# Patient Record
Sex: Female | Born: 1935 | Race: Black or African American | Hispanic: No | Smoking: Former smoker
Health system: Southern US, Community
[De-identification: ages and names within clinical notes are randomized; demographics above are authoritative.]

## PROBLEM LIST (undated history)

## (undated) DIAGNOSIS — Z8719 Personal history of other diseases of the digestive system: Secondary | ICD-10-CM

## (undated) DIAGNOSIS — K219 Gastro-esophageal reflux disease without esophagitis: Secondary | ICD-10-CM

## (undated) DIAGNOSIS — R55 Syncope and collapse: Secondary | ICD-10-CM

## (undated) DIAGNOSIS — F329 Major depressive disorder, single episode, unspecified: Secondary | ICD-10-CM

## (undated) DIAGNOSIS — M069 Rheumatoid arthritis, unspecified: Secondary | ICD-10-CM

## (undated) DIAGNOSIS — I509 Heart failure, unspecified: Secondary | ICD-10-CM

## (undated) DIAGNOSIS — K922 Gastrointestinal hemorrhage, unspecified: Secondary | ICD-10-CM

## (undated) DIAGNOSIS — M797 Fibromyalgia: Secondary | ICD-10-CM

## (undated) DIAGNOSIS — I639 Cerebral infarction, unspecified: Secondary | ICD-10-CM

## (undated) DIAGNOSIS — Z9289 Personal history of other medical treatment: Secondary | ICD-10-CM

## (undated) DIAGNOSIS — I10 Essential (primary) hypertension: Secondary | ICD-10-CM

## (undated) DIAGNOSIS — E119 Type 2 diabetes mellitus without complications: Secondary | ICD-10-CM

## (undated) DIAGNOSIS — G894 Chronic pain syndrome: Secondary | ICD-10-CM

## (undated) DIAGNOSIS — T39395A Adverse effect of other nonsteroidal anti-inflammatory drugs [NSAID], initial encounter: Secondary | ICD-10-CM

## (undated) DIAGNOSIS — I82629 Acute embolism and thrombosis of deep veins of unspecified upper extremity: Secondary | ICD-10-CM

## (undated) DIAGNOSIS — A419 Sepsis, unspecified organism: Secondary | ICD-10-CM

## (undated) DIAGNOSIS — F419 Anxiety disorder, unspecified: Secondary | ICD-10-CM

## (undated) DIAGNOSIS — E7800 Pure hypercholesterolemia, unspecified: Secondary | ICD-10-CM

## (undated) DIAGNOSIS — F32A Depression, unspecified: Secondary | ICD-10-CM

## (undated) HISTORY — PX: JOINT REPLACEMENT: SHX530

## (undated) HISTORY — PX: TOTAL KNEE ARTHROPLASTY: SHX125

---

## 1969-04-11 HISTORY — PX: VAGINAL HYSTERECTOMY: SUR661

## 2009-08-11 DIAGNOSIS — I639 Cerebral infarction, unspecified: Secondary | ICD-10-CM

## 2009-08-11 DIAGNOSIS — Z9289 Personal history of other medical treatment: Secondary | ICD-10-CM

## 2009-08-11 DIAGNOSIS — I82629 Acute embolism and thrombosis of deep veins of unspecified upper extremity: Secondary | ICD-10-CM

## 2009-08-11 HISTORY — DX: Acute embolism and thrombosis of deep veins of unspecified upper extremity: I82.629

## 2009-08-11 HISTORY — DX: Cerebral infarction, unspecified: I63.9

## 2009-08-11 HISTORY — DX: Personal history of other medical treatment: Z92.89

## 2010-01-09 HISTORY — PX: ANTERIOR CERVICAL DISCECTOMY: SHX1160

## 2010-03-11 HISTORY — PX: POSTERIOR LAMINECTOMY / DECOMPRESSION CERVICAL SPINE: SUR739

## 2010-05-28 ENCOUNTER — Encounter: Admission: RE | Admit: 2010-05-28 | Discharge: 2010-05-28 | Payer: Self-pay | Admitting: Internal Medicine

## 2010-06-11 ENCOUNTER — Encounter (HOSPITAL_COMMUNITY)
Admission: RE | Admit: 2010-06-11 | Discharge: 2010-08-10 | Payer: Self-pay | Source: Home / Self Care | Attending: Internal Medicine | Admitting: Internal Medicine

## 2010-08-31 ENCOUNTER — Encounter: Payer: Self-pay | Admitting: Internal Medicine

## 2012-04-11 DIAGNOSIS — T39395A Adverse effect of other nonsteroidal anti-inflammatory drugs [NSAID], initial encounter: Secondary | ICD-10-CM | POA: Diagnosis present

## 2012-04-11 DIAGNOSIS — K922 Gastrointestinal hemorrhage, unspecified: Secondary | ICD-10-CM

## 2012-04-11 HISTORY — DX: Adverse effect of other nonsteroidal anti-inflammatory drugs (NSAID), initial encounter: K92.2

## 2012-05-18 ENCOUNTER — Observation Stay (HOSPITAL_BASED_OUTPATIENT_CLINIC_OR_DEPARTMENT_OTHER)
Admission: EM | Admit: 2012-05-18 | Discharge: 2012-05-20 | Disposition: A | Discharge status: Home / Self Care | Payer: Medicare Other | Attending: Internal Medicine | Admitting: Internal Medicine

## 2012-05-18 ENCOUNTER — Encounter (HOSPITAL_BASED_OUTPATIENT_CLINIC_OR_DEPARTMENT_OTHER): Payer: Self-pay | Admitting: Emergency Medicine

## 2012-05-18 ENCOUNTER — Emergency Department (HOSPITAL_BASED_OUTPATIENT_CLINIC_OR_DEPARTMENT_OTHER): Payer: Medicare Other

## 2012-05-18 DIAGNOSIS — M6281 Muscle weakness (generalized): Principal | ICD-10-CM | POA: Insufficient documentation

## 2012-05-18 DIAGNOSIS — E7800 Pure hypercholesterolemia, unspecified: Secondary | ICD-10-CM | POA: Insufficient documentation

## 2012-05-18 DIAGNOSIS — E119 Type 2 diabetes mellitus without complications: Secondary | ICD-10-CM | POA: Insufficient documentation

## 2012-05-18 DIAGNOSIS — R5381 Other malaise: Secondary | ICD-10-CM

## 2012-05-18 DIAGNOSIS — M069 Rheumatoid arthritis, unspecified: Secondary | ICD-10-CM | POA: Diagnosis present

## 2012-05-18 DIAGNOSIS — K922 Gastrointestinal hemorrhage, unspecified: Secondary | ICD-10-CM

## 2012-05-18 DIAGNOSIS — D899 Disorder involving the immune mechanism, unspecified: Secondary | ICD-10-CM

## 2012-05-18 DIAGNOSIS — I509 Heart failure, unspecified: Secondary | ICD-10-CM | POA: Insufficient documentation

## 2012-05-18 DIAGNOSIS — Z86718 Personal history of other venous thrombosis and embolism: Secondary | ICD-10-CM | POA: Insufficient documentation

## 2012-05-18 DIAGNOSIS — Z8673 Personal history of transient ischemic attack (TIA), and cerebral infarction without residual deficits: Secondary | ICD-10-CM | POA: Insufficient documentation

## 2012-05-18 DIAGNOSIS — K219 Gastro-esophageal reflux disease without esophagitis: Secondary | ICD-10-CM | POA: Insufficient documentation

## 2012-05-18 DIAGNOSIS — R509 Fever, unspecified: Secondary | ICD-10-CM

## 2012-05-18 DIAGNOSIS — I82629 Acute embolism and thrombosis of deep veins of unspecified upper extremity: Secondary | ICD-10-CM

## 2012-05-18 DIAGNOSIS — T39395A Adverse effect of other nonsteroidal anti-inflammatory drugs [NSAID], initial encounter: Secondary | ICD-10-CM

## 2012-05-18 DIAGNOSIS — R531 Weakness: Secondary | ICD-10-CM | POA: Diagnosis present

## 2012-05-18 DIAGNOSIS — R55 Syncope and collapse: Secondary | ICD-10-CM

## 2012-05-18 DIAGNOSIS — I10 Essential (primary) hypertension: Secondary | ICD-10-CM | POA: Insufficient documentation

## 2012-05-18 DIAGNOSIS — IMO0001 Reserved for inherently not codable concepts without codable children: Secondary | ICD-10-CM | POA: Insufficient documentation

## 2012-05-18 DIAGNOSIS — F3289 Other specified depressive episodes: Secondary | ICD-10-CM | POA: Insufficient documentation

## 2012-05-18 DIAGNOSIS — F411 Generalized anxiety disorder: Secondary | ICD-10-CM | POA: Insufficient documentation

## 2012-05-18 DIAGNOSIS — Z791 Long term (current) use of non-steroidal anti-inflammatories (NSAID): Secondary | ICD-10-CM | POA: Insufficient documentation

## 2012-05-18 DIAGNOSIS — M797 Fibromyalgia: Secondary | ICD-10-CM | POA: Diagnosis present

## 2012-05-18 DIAGNOSIS — F329 Major depressive disorder, single episode, unspecified: Secondary | ICD-10-CM | POA: Insufficient documentation

## 2012-05-18 HISTORY — DX: Chronic pain syndrome: G89.4

## 2012-05-18 HISTORY — DX: Personal history of other medical treatment: Z92.89

## 2012-05-18 HISTORY — DX: Anxiety disorder, unspecified: F41.9

## 2012-05-18 HISTORY — DX: Heart failure, unspecified: I50.9

## 2012-05-18 HISTORY — DX: Major depressive disorder, single episode, unspecified: F32.9

## 2012-05-18 HISTORY — DX: Rheumatoid arthritis, unspecified: M06.9

## 2012-05-18 HISTORY — DX: Essential (primary) hypertension: I10

## 2012-05-18 HISTORY — DX: Pure hypercholesterolemia, unspecified: E78.00

## 2012-05-18 HISTORY — DX: Syncope and collapse: R55

## 2012-05-18 HISTORY — DX: Adverse effect of other nonsteroidal anti-inflammatory drugs (NSAID), initial encounter: T39.395A

## 2012-05-18 HISTORY — DX: Type 2 diabetes mellitus without complications: E11.9

## 2012-05-18 HISTORY — DX: Cerebral infarction, unspecified: I63.9

## 2012-05-18 HISTORY — DX: Gastrointestinal hemorrhage, unspecified: K92.2

## 2012-05-18 HISTORY — DX: Gastro-esophageal reflux disease without esophagitis: K21.9

## 2012-05-18 HISTORY — DX: Sepsis, unspecified organism: A41.9

## 2012-05-18 HISTORY — DX: Acute embolism and thrombosis of deep veins of unspecified upper extremity: I82.629

## 2012-05-18 HISTORY — DX: Personal history of other diseases of the digestive system: Z87.19

## 2012-05-18 HISTORY — DX: Fibromyalgia: M79.7

## 2012-05-18 HISTORY — DX: Depression, unspecified: F32.A

## 2012-05-18 LAB — CBC WITH DIFFERENTIAL/PLATELET
Basophils Absolute: 0 10*3/uL (ref 0.0–0.1)
Basophils Relative: 0 % (ref 0–1)
Hemoglobin: 11.1 g/dL — ABNORMAL LOW (ref 12.0–15.0)
Lymphocytes Relative: 21 % (ref 12–46)
MCHC: 33.1 g/dL (ref 30.0–36.0)
Monocytes Relative: 11 % (ref 3–12)
Neutro Abs: 6.3 10*3/uL (ref 1.7–7.7)
Neutrophils Relative %: 67 % (ref 43–77)
WBC: 9.4 10*3/uL (ref 4.0–10.5)

## 2012-05-18 LAB — COMPREHENSIVE METABOLIC PANEL
AST: 21 U/L (ref 0–37)
Albumin: 3 g/dL — ABNORMAL LOW (ref 3.5–5.2)
Alkaline Phosphatase: 29 U/L — ABNORMAL LOW (ref 39–117)
BUN: 8 mg/dL (ref 6–23)
Chloride: 94 mEq/L — ABNORMAL LOW (ref 96–112)
Potassium: 3.7 mEq/L (ref 3.5–5.1)
Total Bilirubin: 0.6 mg/dL (ref 0.3–1.2)

## 2012-05-18 LAB — LACTIC ACID, PLASMA: Lactic Acid, Venous: 1 mmol/L (ref 0.5–2.2)

## 2012-05-18 LAB — URINALYSIS, ROUTINE W REFLEX MICROSCOPIC
Nitrite: NEGATIVE
Specific Gravity, Urine: 1.011 (ref 1.005–1.030)
Urobilinogen, UA: 1 mg/dL (ref 0.0–1.0)

## 2012-05-18 LAB — TROPONIN I
Troponin I: <0.3 ng/mL (ref <0.30)
Troponin I: <0.3 ng/mL (ref <0.30)

## 2012-05-18 MED ORDER — CARVEDILOL 12.5 MG PO TABS
12.5000 mg | ORAL_TABLET | Freq: Every day | ORAL | Status: DC
  Administered 2012-05-19 – 2012-05-20 (×2): 12.5 mg via ORAL
  Filled 2012-05-18 (×3): qty 1

## 2012-05-18 MED ORDER — ONDANSETRON HCL 4 MG/2ML IJ SOLN
4.0000 mg | Freq: Once | INTRAMUSCULAR | Status: AC
  Administered 2012-05-18: 4 mg via INTRAVENOUS
  Filled 2012-05-18: qty 2

## 2012-05-18 MED ORDER — ONDANSETRON HCL 4 MG/2ML IJ SOLN: 4.0000 mg | Freq: Four times a day (QID) | INTRAMUSCULAR | Status: DC | PRN

## 2012-05-18 MED ORDER — SODIUM CHLORIDE 0.9 % IJ SOLN
3.0000 mL | Freq: Two times a day (BID) | INTRAMUSCULAR | Status: DC
  Administered 2012-05-18 – 2012-05-20 (×2): 3 mL via INTRAVENOUS

## 2012-05-18 MED ORDER — SODIUM CHLORIDE 0.9 % IV BOLUS (SEPSIS)
500.0000 mL | Freq: Once | INTRAVENOUS | Status: AC
  Administered 2012-05-18: 1000 mL via INTRAVENOUS

## 2012-05-18 MED ORDER — SODIUM CHLORIDE 0.9 % IJ SOLN: 3.0000 mL | Freq: Two times a day (BID) | INTRAMUSCULAR | Status: DC

## 2012-05-18 MED ORDER — SODIUM CHLORIDE 0.9 % IJ SOLN: 3.0000 mL | INTRAMUSCULAR | Status: DC | PRN

## 2012-05-18 MED ORDER — ONDANSETRON HCL 4 MG PO TABS: 4.0000 mg | ORAL_TABLET | Freq: Four times a day (QID) | ORAL | Status: DC | PRN

## 2012-05-18 MED ORDER — MORPHINE SULFATE 2 MG/ML IJ SOLN
2.0000 mg | Freq: Once | INTRAMUSCULAR | Status: AC
  Administered 2012-05-18: 2 mg via INTRAVENOUS
  Filled 2012-05-18: qty 1

## 2012-05-18 MED ORDER — PANTOPRAZOLE SODIUM 40 MG PO TBEC
40.0000 mg | DELAYED_RELEASE_TABLET | Freq: Every day | ORAL | Status: DC
  Administered 2012-05-19 – 2012-05-20 (×2): 40 mg via ORAL
  Filled 2012-05-18 (×2): qty 1

## 2012-05-18 MED ORDER — SODIUM CHLORIDE 0.9 % IV SOLN: 250.0000 mL | INTRAVENOUS | Status: DC | PRN

## 2012-05-18 MED ORDER — SODIUM CHLORIDE 0.9 % IV SOLN
Freq: Once | INTRAVENOUS | Status: AC
  Administered 2012-05-18: 50 mL via INTRAVENOUS

## 2012-05-18 MED ORDER — SODIUM CHLORIDE 0.9 % IV SOLN: INTRAVENOUS | Status: DC

## 2012-05-18 MED ORDER — ACETAMINOPHEN 325 MG PO TABS
650.0000 mg | ORAL_TABLET | Freq: Once | ORAL | Status: AC
  Administered 2012-05-18: 650 mg via ORAL
  Filled 2012-05-18: qty 2

## 2012-05-18 NOTE — ED Notes (Signed)
Report given to Diane at [facility].

## 2012-05-18 NOTE — Progress Notes (Signed)
RN has paged [facility] admissions for the second time this shift to get admission orders for pt. RN still hasn't received a call back. -Judy Pimple, RN

## 2012-05-18 NOTE — ED Notes (Signed)
Patient given more blankets for comfort. In and out cath done for urine. Patient temp currently 98.2.

## 2012-05-18 NOTE — ED Provider Notes (Cosign Needed)
History     CSN: [id number]  Arrival date & time 05/18/12  1120   First MD Initiated Contact with Patient 05/18/12 1159      Chief Complaint  Patient presents with  . Failure To Thrive    (Consider location/radiation/quality/duration/timing/severity/associated sxs/prior treatment) HPI Patient states nauseated, anxious became lightheaded and had near syncopal episode.  Denies vertigo.  Patient states she got up this a.m. And went in to take meds and went in kitchen and had cereal and mile.  Took other half of vicodin for pain from arthritis, prescribed for left shoulder.  Patien thad needle biopsy of left shoulder end of September.  Patient on humera for ra.  Patient had gi bleed two week ago states just discharged a week ago Sunday, then at [place] last week because she got sick after taking vicodin.  She states she only took a half pill today but became nauseated again.   PMD Dr. Emi Holes at [facility] and Dr. Ancil Linsey rheumatology.  Past Medical History  Diagnosis Date  . Hypertension   . Diabetes mellitus without complication   . Rheumatoid arthritis   . Sepsis     Past Surgical History  Procedure Date  . Abdominal hysterectomy   . Neck surgery     No family history on file.  History  Substance Use Topics  . Smoking status: Not on file  . Smokeless tobacco: Not on file  . Alcohol Use:     OB History    Grav Para Term Preterm Abortions TAB SAB Ect Mult Living                  Review of Systems  Constitutional: Positive for fever, chills and unexpected weight change. Negative for activity change and appetite change.  HENT: Positive for hearing loss, neck pain and neck stiffness. Negative for sore throat, rhinorrhea and sinus pressure.   Eyes: Negative for visual disturbance.  Respiratory: Positive for cough. Negative for shortness of breath.   Cardiovascular: Negative for chest pain and leg swelling.  Gastrointestinal: Negative for vomiting,  abdominal pain, diarrhea and blood in stool.  Genitourinary: Positive for frequency. Negative for dysuria, urgency, vaginal discharge and difficulty urinating.  Musculoskeletal: Negative for myalgias, arthralgias and gait problem.  Skin: Negative for color change and rash.  Neurological: Negative for weakness, light-headedness and headaches.  Hematological: Does not bruise/bleed easily.  Psychiatric/Behavioral: Negative for dysphoric mood.    Allergies  Codeine  Home Medications   Current Outpatient Rx  Name Route Sig Dispense Refill  . ATORVASTATIN CALCIUM PO Oral Take by mouth.    Marland Kitchen HYDROCODONE-ACETAMINOPHEN 5-325 MG PO TABS Oral Take 1 tablet by mouth every 6 (six) hours as needed.    Marland Kitchen METFORMIN HCL PO Oral Take by mouth.    Marland Kitchen PANTOPRAZOLE SODIUM PO Oral Take by mouth.    Marland Kitchen PREDNISONE (PAK) PO Oral Take by mouth.    Marland Kitchen LYRICA PO Oral Take by mouth.    . TRAMADOL HCL 50 MG PO TBDP Oral Take by mouth.      BP 155/95  Pulse 119  Temp 100.7 F (38.2 C) (Oral)  Resp 16  SpO2 95%  Physical Exam  Nursing note and vitals reviewed. Constitutional: She is oriented to person, place, and time. She appears well-developed and well-nourished.  HENT:  Head: Normocephalic and atraumatic.  Right Ear: External ear normal.  Left Ear: External ear normal.  Eyes: Conjunctivae normal are normal. Pupils are equal, round, and reactive  to light.  Neck: Normal range of motion.  Cardiovascular: Normal heart sounds and intact distal pulses.  Tachycardia present.   Pulmonary/Chest: Effort normal and breath sounds normal.  Abdominal: Soft. Bowel sounds are normal.  Musculoskeletal: Normal range of motion.  Neurological: She is alert and oriented to person, place, and time.  Skin: Skin is warm and dry.  Psychiatric: She has a normal mood and affect.    ED Course  Procedures (including critical care time)  Labs Reviewed - No data to display No results found.   No diagnosis  found.    MDM  Patient's hr decreased after iv fluids but remains tachycardiac with elevated temp and unclear etiology on humera for ra. Plan admission over night to follow hr and lactic acid and follow up blood cultures as no focus of infection found.        Hilario Quarry, MD 05/18/12 1524  Patient care discussed with Dr. Suanne Marker and plan transfer to [facility] to tele bed team 10.   Hilario Quarry, MD 05/18/12 [phone]

## 2012-05-18 NOTE — H&P (Signed)
Chief Complaint:  weakness  HPI: 76 yo female h/o RA on humara for 10 years, recent nsaid induced gib (no PUD per family), comes in after experiencing some generalized weakness this am after taking 1/2 pill of percocet.  ems was called and pt was found to have a low grade temp so was then brought to urgent care.  Pt denies any recent illnesses, no cough/n/v/d/abd pain/sob/cp.  She has had a reaction like this to hydrocodone about a week or so which also resulted in a hospitalization at antoher facility.  She feels back to her baseline status right now.  Pt was transferred here for obs from [facility] center due to her immunosuppressed status and low grade fever.  cxr and ua neg.  Review of Systems:  O/w neg  Past Medical History: Past Medical History  Diagnosis Date  . Hypertension   . Sepsis   . Syncope, near 05/18/2012  . GI bleed due to NSAIDs 04/2012    hospitalized  . H/O hiatal hernia   . High cholesterol   . CHF (congestive heart failure)   . DVT of upper extremity (deep vein thrombosis) 2011    "after a surgery"  . Type II diabetes mellitus   . History of blood transfusion 2011    "post Coumadin administered for DVT, got 6 units PRBCs cause she was bleeing out in her stools"  . GERD (gastroesophageal reflux disease)   . Rheumatoid arthritis     "all over" (05/18/2012)  . Stroke 2011    "found old mini strokes on CAT scan" (05/18/2012)  . Fibromyalgia     "questionable" (05/18/2012)  . Chronic pain syndrome   . Anxiety   . Depression    Past Surgical History  Procedure Date  . Anterior cervical discectomy 01/2010  . Posterior laminectomy / decompression cervical spine 03/2010  . Total knee arthroplasty ~ 2001    right; "found broken bone in leg during OR" (05/18/2012)  . Vaginal hysterectomy 1970's  . Joint replacement     Medications: Prior to Admission medications   Medication Sig Start Date End Date Taking? Authorizing Provider  Adalimumab (HUMIRA) 20 MG/0.4ML KIT  Inject 20 mg into the skin every 14 (fourteen) days.   Yes Historical Provider, MD  ATORVASTATIN CALCIUM PO Take 1 tablet by mouth daily.    Yes Historical Provider, MD  carvedilol (COREG) 12.5 MG tablet Take 12.5 mg by mouth every morning.   Yes Historical Provider, MD  HYDROcodone-acetaminophen (NORCO/VICODIN) 5-325 MG per tablet Take 1 tablet by mouth every 6 (six) hours as needed. For pain   Yes Historical Provider, MD  METFORMIN HCL PO Take 1 tablet by mouth 2 (two) times daily.    Yes Historical Provider, MD  pantoprazole (PROTONIX) 40 MG tablet Take 40 mg by mouth daily.   Yes Historical Provider, MD  traMADol (ULTRAM) 50 MG tablet Take 50 mg by mouth every 4 (four) hours as needed. For pain   Yes Historical Provider, MD    Allergies:   Allergies  Allergen Reactions  . Codeine Nausea And Vomiting    Social History:  reports that she has quit smoking. Her smoking use included Cigarettes. She quit after 43 years of use. She has never used smokeless tobacco. She reports that she does not drink alcohol or use illicit drugs.  Family History: History reviewed. No pertinent family history.  Physical Exam: Filed Vitals:   05/18/12 1733 05/18/12 1738 05/18/12 1852 05/18/12 2119  BP:  121/65 120/50 147/72  Pulse: 107  110 97  Temp: 99.5 F (37.5 C)  96.8 F (36 C) 98.8 F (37.1 C)  TempSrc:   Oral Oral  Resp: 16  20 20   SpO2: 91%  90% 100%   General appearance: alert, cooperative and no distress Neck: no JVD and supple, symmetrical, trachea midline Lungs: clear to auscultation bilaterally Heart: regular rate and rhythm, S1, S2 normal, no murmur, click, rub or gallop Abdomen: soft, non-tender; bowel sounds normal; no masses,  no organomegaly Extremities: extremities normal, atraumatic, no cyanosis or edema Pulses: 2+ and symmetric Skin: Skin color, texture, turgor normal. No rashes or lesions Neurologic: Grossly normal    Labs on Admission:   [facility] 05/18/12 1247  NA  131*  K 3.7  CL 94*  CO2 27  GLUCOSE 211*  BUN 8  CREATININE 0.50  CALCIUM 9.1  MG --  PHOS --    Basename 05/18/12 1247  AST 21  ALT 17  ALKPHOS 29*  BILITOT 0.6  PROT 6.4  ALBUMIN 3.0*    Basename 05/18/12 1247  WBC 9.4  NEUTROABS 6.3  HGB 11.1*  HCT 33.5*  MCV 86.1  PLT 322    Basename 05/18/12 1247  CKTOTAL --  CKMB --  CKMBINDEX --  TROPONINI <0.30   Radiological Exams on Admission: Dg Chest 2 View  05/18/2012  *RADIOLOGY REPORT*  Clinical Data: Pain.  Fever.  Weakness.  Sepsis.  Diabetes. Rheumatoid arthropathy.  CHEST - 2 VIEW  Comparison: 05/28/2010  Findings: Atherosclerotic aortic arch noted with mild cardiomegaly. Glenohumeral arthropathy noted bilaterally.  Accentuated upper lobe vascularity favors pulmonary venous hypertension.  No overt edema.  No pleural effusion identified.  Thoracic spondylosis is present.  IMPRESSION: 1.  Cardiomegaly with pulmonary venous hypertension.  2.  Atherosclerotic aortic arch.  3.  Arthropathy of both glenohumeral joints.   Original Report Authenticated By: Dellia Cloud, M.D.     Assessment/Plan Present on Admission:  76 yo female generalized weakness and fever of unclear etiology .Fever .RA (rheumatoid arthritis) .Diabetes .Weakness generalized .CHF (congestive heart failure) .DVT of upper extremity (deep vein thrombosis) history .Fibromyalgia .GI bleed due to NSAIDs  No clear source of infection.  Exam is normal, normal neuro exam.  Weakness likely due to the hydrocodone that she took on an empty stomach this am.  Monitor closely overnight for any further fevers.  Blood cx pending.  Hold off on abx at this time.  Ck orthostatics also and neuro cks overnight.  chf compensated.   Stanley Lyness A [phone] 05/18/2012, 10:36 PM

## 2012-05-18 NOTE — ED Notes (Signed)
EMS was called out for dizziness and feeling like she was "going to pass out."  Was recently at [facility] for GI bleed.  Daughter states she feels she just took oxycodone last night and no coreg this am.

## 2012-05-18 NOTE — Progress Notes (Signed)
Patient is a 76 year old history of rheumatoid arthritis on Humira, hypertension, diabetes who presented to the [facility] ED with generalized body pains/aches and was found to be febrile to 100.8 tachycardic to the 120s. Chest x-ray with no acute infiltrates and urinalysis unremarkable, lactic acid level within normal limits. Blood and urine cultures ordered and she needs admission-H& P. and orders to telemetry bed.

## 2012-05-19 ENCOUNTER — Encounter (HOSPITAL_COMMUNITY): Payer: Self-pay | Admitting: *Deleted

## 2012-05-19 DIAGNOSIS — I509 Heart failure, unspecified: Secondary | ICD-10-CM

## 2012-05-19 LAB — GLUCOSE, CAPILLARY
Glucose-Capillary: 81 mg/dL (ref 70–99)
Glucose-Capillary: 109 mg/dL — ABNORMAL HIGH (ref 70–99)

## 2012-05-19 LAB — BASIC METABOLIC PANEL
Chloride: 98 mEq/L (ref 96–112)
GFR calc Af Amer: >90 mL/min (ref >90)
GFR calc non Af Amer: >90 mL/min (ref >90)
Potassium: 3.7 mEq/L (ref 3.5–5.1)
Sodium: 132 mEq/L — ABNORMAL LOW (ref 135–145)

## 2012-05-19 LAB — CBC
HCT: 33 % — ABNORMAL LOW (ref 36.0–46.0)
Hemoglobin: 10.6 g/dL — ABNORMAL LOW (ref 12.0–15.0)
RBC: 3.83 MIL/uL — ABNORMAL LOW (ref 3.87–5.11)

## 2012-05-19 LAB — URINE CULTURE: Culture: NO GROWTH

## 2012-05-19 LAB — TROPONIN I: Troponin I: <0.3 ng/mL (ref <0.30)

## 2012-05-19 MED ORDER — ALPRAZOLAM 0.25 MG PO TABS
0.2500 mg | ORAL_TABLET | Freq: Three times a day (TID) | ORAL | Status: DC | PRN
  Administered 2012-05-19: 0.25 mg via ORAL
  Filled 2012-05-19: qty 1

## 2012-05-19 MED ORDER — ACETAMINOPHEN 500 MG PO TABS
1000.0000 mg | ORAL_TABLET | Freq: Three times a day (TID) | ORAL | Status: DC
  Administered 2012-05-19 – 2012-05-20 (×4): 1000 mg via ORAL
  Filled 2012-05-19 (×7): qty 2

## 2012-05-19 MED ORDER — TRAMADOL HCL 50 MG PO TABS
50.0000 mg | ORAL_TABLET | Freq: Four times a day (QID) | ORAL | Status: DC | PRN
  Administered 2012-05-19: 50 mg via ORAL
  Filled 2012-05-19: qty 1

## 2012-05-19 MED ORDER — INSULIN ASPART 100 UNIT/ML ~~LOC~~ SOLN: 0.0000 [IU] | Freq: Three times a day (TID) | SUBCUTANEOUS | Status: DC

## 2012-05-19 MED ORDER — SODIUM CHLORIDE 0.9 % IV SOLN
INTRAVENOUS | Status: AC
  Administered 2012-05-19: 50 mL via INTRAVENOUS

## 2012-05-19 MED ORDER — TRAMADOL HCL 50 MG PO TABS
50.0000 mg | ORAL_TABLET | Freq: Once | ORAL | Status: AC
  Administered 2012-05-19: 50 mg via ORAL
  Filled 2012-05-19: qty 1

## 2012-05-19 MED ORDER — LORAZEPAM 0.5 MG PO TABS
0.5000 mg | ORAL_TABLET | Freq: Once | ORAL | Status: AC
  Administered 2012-05-19: 0.5 mg via ORAL
  Filled 2012-05-19: qty 1

## 2012-05-19 MED ORDER — HYDROCODONE-ACETAMINOPHEN 5-325 MG PO TABS: 1.0000 | ORAL_TABLET | Freq: Once | ORAL | Status: DC

## 2012-05-19 NOTE — Evaluation (Signed)
Physical Therapy Evaluation Patient Details Name: Theresa Hawkins MRN: [id number] DOB: 1935/09/02 Today's Date: 05/19/2012 Time: 4098-1191 PT Time Calculation (min): 46 min  PT Assessment / Plan / Recommendation Clinical Impression  pt presents with weakness and RA.  pt generally unsteady and requires A for all OOB activity.  pt interested in speaking with SW about planning for future need of Nursing Home vs ALF and has lots of questions.  If unable to have SW on acute can we get order for HHSW?  pt would benefit from HHPT and return to daughters home.      PT Assessment  Patient needs continued PT services    Follow Up Recommendations  Home health PT;Supervision/Assistance - 24 hour    Does the patient have the potential to tolerate intense rehabilitation      Barriers to Discharge None      Equipment Recommendations  None recommended by PT    Recommendations for Other Services  (SW Consult)   Frequency Min 3X/week    Precautions / Restrictions Precautions Precautions: Fall Restrictions Weight Bearing Restrictions: No   Pertinent Vitals/Pain Pt indicates RA pain in Shoulders and L knee.        Mobility  Bed Mobility Bed Mobility: Not assessed Transfers Transfers: Sit to Stand;Stand to Sit Sit to Stand: 4: Min assist;3: Mod assist;With upper extremity assist;From chair/3-in-1;With armrests Stand to Sit: 4: Min assist;With upper extremity assist;To chair/3-in-1;With armrests Details for Transfer Assistance: pt ModA from low recliner, but only MinA from 3-in-1.  pt notes she uses seat cushions at home.  Pillows placed in recliner seat.  pt rocks to use momentum with sit to stand, and difficulty controlling descent.   Ambulation/Gait Ambulation/Gait Assistance: 4: Min assist Ambulation Distance (Feet): 100 Feet Assistive device: Straight cane Ambulation/Gait Assistance Details: pt generally unsteady and at times needs handrail in hallway.  pt with trouble holding cane  secondary to RA in hands and often switches hands.   Gait Pattern: Step-through pattern;Decreased stride length;Trunk flexed;Wide base of support Stairs: No Wheelchair Mobility Wheelchair Mobility: No    Shoulder Instructions     Exercises     PT Diagnosis: Difficulty walking  PT Problem List: Decreased strength;Decreased range of motion;Decreased activity tolerance;Decreased balance;Decreased mobility;Decreased knowledge of use of DME;Pain PT Treatment Interventions: DME instruction;Gait training;Stair training;Functional mobility training;Therapeutic activities;Therapeutic exercise;Balance training;Patient/family education   PT Goals Acute Rehab PT Goals PT Goal Formulation: With patient Time For Goal Achievement: 06/02/12 Potential to Achieve Goals: Good Pt will go Supine/Side to Sit: with modified independence PT Goal: Supine/Side to Sit - Progress: Goal set today Pt will go Sit to Supine/Side: with modified independence PT Goal: Sit to Supine/Side - Progress: Goal set today Pt will go Sit to Stand: with modified independence PT Goal: Sit to Stand - Progress: Goal set today Pt will go Stand to Sit: with modified independence PT Goal: Stand to Sit - Progress: Goal set today Pt will Ambulate: >150 feet;with modified independence;with least restrictive assistive device PT Goal: Ambulate - Progress: Goal set today Pt will Go Up / Down Stairs: 3-5 stairs;with min assist;with rail(s) PT Goal: Up/Down Stairs - Progress: Goal set today  Visit Information  Last PT Received On: 05/19/12 Assistance Needed: +1    Subjective Data  Subjective: That medicine just made me so weak.   Patient Stated Goal: Home   Prior Functioning  Home Living Lives With: Daughter Available Help at Discharge: Family;Available 24 hours/day Type of Home: House Home Access: Stairs to enter Entrance  Stairs-Number of Steps: 3 Home Layout: Two level;Able to live on main level with bedroom/bathroom Home  Adaptive Equipment: Bedside commode/3-in-1;Raised toilet seat with rails;Shower chair with back;Straight cane;Walker - rolling;Wheelchair - manual;Electric Scooter;Hospital bed Additional Comments: pt has her own home, but has been staying with daughter.   Prior Function Level of Independence: Independent with assistive device(s) Able to Take Stairs?: Yes (only a few, not a whole flight.  ) Driving: No Vocation: Retired Musician: No difficulties    Cognition  Overall Cognitive Status: Appears within functional limits for tasks assessed/performed Arousal/Alertness: Awake/alert Orientation Level: Appears intact for tasks assessed Behavior During Session: [facility] for tasks performed    Extremity/Trunk Assessment Right Lower Extremity Assessment RLE ROM/Strength/Tone: Deficits RLE ROM/Strength/Tone Deficits: Limited knee flexion to ~90 degrees secondary to pain.  pt indicates plan for R TKA.   RLE Sensation: WFL - Light Touch Left Lower Extremity Assessment LLE ROM/Strength/Tone: Deficits LLE ROM/Strength/Tone Deficits: pt's RA limits ankle/foot ROM and general strength 4-/5 LLE Sensation: WFL - Light Touch Trunk Assessment Trunk Assessment: Kyphotic   Balance Balance Balance Assessed: Yes Static Standing Balance Static Standing - Balance Support: Right upper extremity supported Static Standing - Level of Assistance: 5: Stand by assistance Static Standing - Comment/# of Minutes: pt able to stand with unilateral UE support, but once incorperating activities pt requires MinA to maintain balance.    End of Session PT - End of Session Equipment Utilized During Treatment: Gait belt Activity Tolerance: Patient tolerated treatment well Patient left: in chair;with call bell/phone within reach Nurse Communication: Mobility status  GP Functional Assessment Tool Used: Clinical Judgement Functional Limitation: Mobility: Walking and moving around Mobility: Walking and Moving  Around Current Status (J1914): At least 20 percent but less than 40 percent impaired, limited or restricted Mobility: Walking and Moving Around Goal Status [phone]): At least 1 percent but less than 20 percent impaired, limited or restricted   Sunny Schlein,  [phone] 05/19/2012, 3:17 PM

## 2012-05-19 NOTE — Progress Notes (Signed)
Pt refuses to stand and let NT get her orthostatic vital signs

## 2012-05-19 NOTE — Progress Notes (Signed)
Pt refused to stand for her orthostatic vital signs

## 2012-05-19 NOTE — Progress Notes (Signed)
PATIENT DETAILS Name: Theresa Hawkins Age: 76 y.o. Sex: female Date of Birth: May 30, 1936 Admit Date: 05/18/2012 Admitting Physician Kela Millin, MD PCP:No primary provider on file.  Subjective: Low grade temp overnight  Assessment/Plan: Principal Problem:  *Weakness generalized -although low grade fever-not toxic looking-stable vitals -given immuno-suppression-infection is suspected-however no obvious source apparent-Urine and Blood cultures pending -will not start any antibiotics at present-unless worsening of patient's condition -apparently-has had similar problems with vicoding in the past-will not re-challenge-seems to tolerate Ultram better -get PT  Active Problems:  Fever -as above   RA (rheumatoid arthritis) -follow's up with Rheum as outpatinet -on Humira -painful mulltiple joints-will not give NSAID's as recent UGI bleed-start scheduled Tylenol and prn Ultram   Diabetes -stable with SSI -resume Metformin on discharge   GI bleed due to NSAIDs -c/w PPI -obtain discharge summary from [facility]   CHF (congestive heart failure) -?systolic vs diastolic -clinically compensated -c/w coreg -await futher records from [facility]   Fibromyalgia -ultram and tylenol  Anxiety -as needed Xanax  Disposition: Remain inpatient  DVT Prophylaxis: SCD's till d/c summary from [facility] obtained  Code Status: Full code or DNR  Procedures:  None  CONSULTS:  None  PHYSICAL EXAM: Vital signs in last 24 hours: Filed Vitals:   05/19/12 0300 05/19/12 0452 05/19/12 0519 05/19/12 0903  BP:  125/63  123/70  Pulse:  115  97  Temp:  100.2 F (37.9 C)    TempSrc:  Oral    Resp:  18    Height: 5\' 5"  (1.651 m)     Weight:   71 kg (156 lb 8.4 oz)   SpO2:  99%      Weight change:  Body mass index is 26.05 kg/(m^2).   Gen Exam: Awake and alert with clear speech.   Neck: Supple, No JVD.   Chest: B/L Clear.   CVS: S1 S2  Regular, no murmurs.  Abdomen: soft, BS +, non tender, non distended.  Extremities: no edema, lower extremities warm to touch. Neurologic: Non Focal.   Skin: No Rash.   Wounds: N/A.    Intake/Output from previous day:  Intake/Output Summary (Last 24 hours) at 05/19/12 1235 Last data filed at 05/19/12 0900  Gross per 24 hour  Intake    240 ml  Output    200 ml  Net     40 ml     LAB RESULTS: CBC  Lab 05/19/12 0510 05/18/12 1247  WBC 9.3 9.4  HGB 10.6* 11.1*  HCT 33.0* 33.5*  PLT 318 322  MCV 86.2 86.1  MCH 27.7 28.5  MCHC 32.1 33.1  RDW 13.7 13.3  LYMPHSABS -- 1.9  MONOABS -- 1.0  EOSABS -- 0.2  BASOSABS -- 0.0  BANDABS -- --    Chemistries   Lab 05/19/12 0510 05/18/12 1247  NA 132* 131*  K 3.7 3.7  CL 98 94*  CO2 25 27  GLUCOSE 195* 211*  BUN 6 8  CREATININE 0.50 0.50  CALCIUM 9.2 9.1  MG -- --    CBG:  Lab 05/19/12 1204 05/19/12 0736 05/18/12 2122  GLUCAP 134* 164* 81    GFR Estimated Creatinine Clearance: 61 ml/min (by C-G formula based on Cr of 0.5).  Coagulation profile No results found for this basename: INR:5,PROTIME:5 in the last 168 hours  Cardiac Enzymes  Lab 05/19/12 0510 05/18/12 2252 05/18/12 1247  CKMB -- -- --  TROPONINI <0.30 <0.30 <0.30  MYOGLOBIN -- -- --  No components found with this basename: POCBNP:3 No results found for this basename: DDIMER:2 in the last 72 hours No results found for this basename: HGBA1C:2 in the last 72 hours No results found for this basename: CHOL:2,HDL:2,LDLCALC:2,TRIG:2,CHOLHDL:2,LDLDIRECT:2 in the last 72 hours  Basename 05/18/12 2253  TSH 0.319*  T4TOTAL --  T3FREE --  THYROIDAB --   No results found for this basename: VITAMINB12:2,FOLATE:2,FERRITIN:2,TIBC:2,IRON:2,RETICCTPCT:2 in the last 72 hours No results found for this basename: LIPASE:2,AMYLASE:2 in the last 72 hours  Urine Studies No results found for this basename:  UACOL:2,UAPR:2,USPG:2,UPH:2,UTP:2,UGL:2,UKET:2,UBIL:2,UHGB:2,UNIT:2,UROB:2,ULEU:2,UEPI:2,UWBC:2,URBC:2,UBAC:2,CAST:2,CRYS:2,UCOM:2,BILUA:2 in the last 72 hours  MICROBIOLOGY: Recent Results (from the past 240 hour(s))  CULTURE, BLOOD (ROUTINE X 2)     Status: Normal (Preliminary result)   Collection Time   05/18/12 12:47 PM      Component Value Range Status Comment   Specimen Description Blood   Final    Special Requests Immunocompromised   Final    Culture  Setup Time 05/18/2012 16:03   Final    Culture     Final    Value:        BLOOD CULTURE RECEIVED NO GROWTH TO DATE CULTURE WILL BE HELD FOR 5 DAYS BEFORE ISSUING A FINAL NEGATIVE REPORT   Report Status PENDING   Incomplete   CULTURE, BLOOD (ROUTINE X 2)     Status: Normal (Preliminary result)   Collection Time   05/18/12 12:47 PM      Component Value Range Status Comment   Specimen Description Blood   Final    Special Requests Immunocompromised   Final    Culture  Setup Time 05/18/2012 16:01   Final    Culture     Final    Value:        BLOOD CULTURE RECEIVED NO GROWTH TO DATE CULTURE WILL BE HELD FOR 5 DAYS BEFORE ISSUING A FINAL NEGATIVE REPORT   Report Status PENDING   Incomplete     RADIOLOGY STUDIES/RESULTS: Dg Chest 2 View  05/18/2012  *RADIOLOGY REPORT*  Clinical Data: Pain.  Fever.  Weakness.  Sepsis.  Diabetes. Rheumatoid arthropathy.  CHEST - 2 VIEW  Comparison: 05/28/2010  Findings: Atherosclerotic aortic arch noted with mild cardiomegaly. Glenohumeral arthropathy noted bilaterally.  Accentuated upper lobe vascularity favors pulmonary venous hypertension.  No overt edema.  No pleural effusion identified.  Thoracic spondylosis is present.  IMPRESSION: 1.  Cardiomegaly with pulmonary venous hypertension.  2.  Atherosclerotic aortic arch.  3.  Arthropathy of both glenohumeral joints.   Original Report Authenticated By: Dellia Cloud, M.D.     MEDICATIONS: Scheduled Meds:   . sodium chloride   Intravenous Once  .  acetaminophen  1,000 mg Oral Q8H  . acetaminophen  650 mg Oral Once  . carvedilol  12.5 mg Oral Q breakfast  . insulin aspart  0-9 Units Subcutaneous TID WC  . LORazepam  0.5 mg Oral Once  .  morphine injection  2 mg Intravenous Once  . ondansetron  4 mg Intravenous Once  . pantoprazole  40 mg Oral Q1200  . sodium chloride  500 mL Intravenous Once  . sodium chloride  3 mL Intravenous Q12H  . traMADol  50 mg Oral Once  . DISCONTD: sodium chloride   Intravenous STAT  . DISCONTD: HYDROcodone-acetaminophen  1 tablet Oral Once  . DISCONTD: sodium chloride  3 mL Intravenous Q12H   Continuous Infusions:  PRN Meds:.ALPRAZolam, ondansetron (ZOFRAN) IV, ondansetron, traMADol, DISCONTD: sodium chloride, DISCONTD: sodium chloride  Antibiotics: Anti-infectives  None       Jeoffrey Massed, MD  [facility] Pager:[phone]  If 7PM-7AM, please contact night-coverage [email or web address] Password [email or web address] 05/19/2012, 12:35 PM   LOS: 1 day

## 2012-05-20 DIAGNOSIS — IMO0001 Reserved for inherently not codable concepts without codable children: Secondary | ICD-10-CM

## 2012-05-20 LAB — CBC
HCT: 34.8 % — ABNORMAL LOW (ref 36.0–46.0)
Hemoglobin: 11.2 g/dL — ABNORMAL LOW (ref 12.0–15.0)
RDW: 13.8 % (ref 11.5–15.5)
WBC: 7.9 10*3/uL (ref 4.0–10.5)

## 2012-05-20 MED ORDER — TRAMADOL HCL 50 MG PO TABS: 50.0000 mg | ORAL_TABLET | Freq: Four times a day (QID) | ORAL | Status: DC | PRN

## 2012-05-20 NOTE — Progress Notes (Signed)
Pt. Skin intact at discharge.  Forbes Cellar, RN

## 2012-05-20 NOTE — Discharge Summary (Signed)
PNoneATIENT DETAILS Name: Theresa Hawkins Age: 75 y.o. Sex: female Date of Birth: 1936/04/05 MRN: [id number]. Admit Date: 05/18/2012 Admitting Physician: Kela Millin, MD WUJ:[id number], Gwynneth Albright, MD  Recommendations for Outpatient Follow-up:  1. Optimize pain regimen  PRIMARY DISCHARGE DIAGNOSIS:  Principal Problem:  *Weakness generalized Active Problems:  Fever  RA (rheumatoid arthritis)  Diabetes  GI bleed due to NSAIDs  CHF (congestive heart failure)  Fibromyalgia  DVT of upper extremity (deep vein thrombosis) history      PAST MEDICAL HISTORY: Past Medical History  Diagnosis Date  . Hypertension   . Sepsis   . Syncope, near 05/18/2012  . GI bleed due to NSAIDs 04/2012    hospitalized  . H/O hiatal hernia   . High cholesterol   . CHF (congestive heart failure)   . DVT of upper extremity (deep vein thrombosis) 2011    "after a surgery"  . Type II diabetes mellitus   . History of blood transfusion 2011    "post Coumadin administered for DVT, got 6 units PRBCs cause she was bleeing out in her stools"  . GERD (gastroesophageal reflux disease)   . Rheumatoid arthritis     "all over" (05/18/2012)  . Stroke 2011    "found old mini strokes on CAT scan" (05/18/2012)  . Fibromyalgia     "questionable" (05/18/2012)  . Chronic pain syndrome   . Anxiety   . Depression     DISCHARGE MEDICATIONS:   Medication List     As of 05/20/2012 11:29 AM    STOP taking these medications         HYDROcodone-acetaminophen 5-325 MG per tablet   Commonly known as: NORCO/VICODIN      TAKE these medications         acetaminophen 500 MG tablet   Commonly known as: TYLENOL   Take 1,000 mg by mouth every 6 (six) hours as needed. For pain      ALPRAZolam 0.25 MG tablet   Commonly known as: XANAX   Take 0.25 mg by mouth 3 (three) times daily as needed. For anxiety      atorvastatin 10 MG tablet   Commonly known as: LIPITOR   Take 10 mg by mouth daily.      B-12 PO   Take 1 tablet  by mouth daily.      carvedilol 12.5 MG tablet   Commonly known as: COREG   Take 12.5 mg by mouth every morning.      HUMIRA 20 MG/0.4ML Kit   Generic drug: Adalimumab   Inject 20 mg into the skin every 14 (fourteen) days.      hydroxypropyl methylcellulose 2.5 % ophthalmic solution   Commonly known as: ISOPTO TEARS   Place 2 drops into both eyes 4 (four) times daily as needed. For dry eyes      metFORMIN 500 MG tablet   Commonly known as: GLUCOPHAGE   Take 500 mg by mouth 2 (two) times daily with a meal.      multivitamin with minerals Tabs   Take 1 tablet by mouth daily.      pantoprazole 40 MG tablet   Commonly known as: PROTONIX   Take 40 mg by mouth daily.      traMADol 50 MG tablet   Commonly known as: ULTRAM   Take 1 tablet (50 mg total) by mouth every 6 (six) hours as needed. For pain         BRIEF HPI:  See H&P, Labs, Consult  and Test reports for all details in brief, patient was admitted for nausea, anxiety and lightheadedness with a near-syncopal episode after she took a Vicodin tablet. She was then brought to med center [facility], which was found to have a low-grade fever and tachycardia, because of the fact that she is on Humira and was considered immunosuppressed she was then transferred to the hospitalist service here at [facility] further evaluation and treatment. She apparently had a similar symptom recently after taking Vicodin.  CONSULTATIONS:   None  PERTINENT RADIOLOGIC STUDIES: Dg Chest 2 View  05/18/2012  *RADIOLOGY REPORT*  Clinical Data: Pain.  Fever.  Weakness.  Sepsis.  Diabetes. Rheumatoid arthropathy.  CHEST - 2 VIEW  Comparison: 05/28/2010  Findings: Atherosclerotic aortic arch noted with mild cardiomegaly. Glenohumeral arthropathy noted bilaterally.  Accentuated upper lobe vascularity favors pulmonary venous hypertension.  No overt edema.  No pleural effusion identified.  Thoracic spondylosis is present.  IMPRESSION: 1.  Cardiomegaly  with pulmonary venous hypertension.  2.  Atherosclerotic aortic arch.  3.  Arthropathy of both glenohumeral joints.   Original Report Authenticated By: Dellia Cloud, M.D.      PERTINENT LAB RESULTS: CBC:  Basename 05/20/12 0628 05/19/12 0510  WBC 7.9 9.3  HGB 11.2* 10.6*  HCT 34.8* 33.0*  PLT 329 318   CMET CMP     Component Value Date/Time   NA 132* 05/19/2012 0510   K 3.7 05/19/2012 0510   CL 98 05/19/2012 0510   CO2 25 05/19/2012 0510   GLUCOSE 195* 05/19/2012 0510   BUN 6 05/19/2012 0510   CREATININE 0.50 05/19/2012 0510   CALCIUM 9.2 05/19/2012 0510   PROT 6.4 05/18/2012 1247   ALBUMIN 3.0* 05/18/2012 1247   AST 21 05/18/2012 1247   ALT 17 05/18/2012 1247   ALKPHOS 29* 05/18/2012 1247   BILITOT 0.6 05/18/2012 1247   GFRNONAA >90 05/19/2012 0510   GFRAA >90 05/19/2012 0510    GFR Estimated Creatinine Clearance: 61 ml/min (by C-G formula based on Cr of 0.5). No results found for this basename: LIPASE:2,AMYLASE:2 in the last 72 hours  Basename 05/19/12 1105 05/19/12 0510 05/18/12 2252  CKTOTAL -- -- --  CKMB -- -- --  CKMBINDEX -- -- --  TROPONINI <0.30 <0.30 <0.30   No components found with this basename: POCBNP:3 No results found for this basename: DDIMER:2 in the last 72 hours No results found for this basename: HGBA1C:2 in the last 72 hours No results found for this basename: CHOL:2,HDL:2,LDLCALC:2,TRIG:2,CHOLHDL:2,LDLDIRECT:2 in the last 72 hours  Basename 05/18/12 2253  TSH 0.319*  T4TOTAL --  T3FREE --  THYROIDAB --   No results found for this basename: VITAMINB12:2,FOLATE:2,FERRITIN:2,TIBC:2,IRON:2,RETICCTPCT:2 in the last 72 hours Coags: No results found for this basename: PT:2,INR:2 in the last 72 hours Microbiology: Recent Results (from the past 240 hour(s))  CULTURE, BLOOD (ROUTINE X 2)     Status: Normal (Preliminary result)   Collection Time   05/18/12 12:47 PM      Component Value Range Status Comment   Specimen Description Blood   Final     Special Requests Immunocompromised   Final    Culture  Setup Time 05/18/2012 16:03   Final    Culture     Final    Value:        BLOOD CULTURE RECEIVED NO GROWTH TO DATE CULTURE WILL BE HELD FOR 5 DAYS BEFORE ISSUING A FINAL NEGATIVE REPORT   Report Status PENDING   Incomplete   CULTURE,  BLOOD (ROUTINE X 2)     Status: Normal (Preliminary result)   Collection Time   05/18/12 12:47 PM      Component Value Range Status Comment   Specimen Description Blood   Final    Special Requests Immunocompromised   Final    Culture  Setup Time 05/18/2012 16:01   Final    Culture     Final    Value:        BLOOD CULTURE RECEIVED NO GROWTH TO DATE CULTURE WILL BE HELD FOR 5 DAYS BEFORE ISSUING A FINAL NEGATIVE REPORT   Report Status PENDING   Incomplete   URINE CULTURE     Status: Normal   Collection Time   05/18/12  2:13 PM      Component Value Range Status Comment   Specimen Description URINE, CATHETERIZED   Final    Special Requests Immunocompromised   Final    Culture  Setup Time 05/19/2012 00:53   Final    Colony Count NO GROWTH   Final    Culture NO GROWTH   Final    Report Status 05/19/2012 FINAL   Final      BRIEF HOSPITAL COURSE:   Principal Problem:  *Weakness generalized -This occurred after the patient took a tablet of Vicodin, she subsequently had generalized weakness, lightheadedness and near syncopal episode. She apparently has had similar symptoms with Vicodin in the past. She has severe rheumatoid arthritis and fibromyalgia, and is on Humira. For these reasons she was brought to med center [facility], where she was found to be slightly tachycardic and had a low-grade fever. She was then transferred to [facility] for further evaluation and treatment. She was subsequently admitted to the hospitalist service, blood and urine cultures were drawn which are negative to date. A chest x-ray did not show any acute abnormalities. Patient was watched and monitored off antibiotics, since  her cultures are negative x2 days and she is afebrile and nontoxic looking, she will be discharged home today. She will then followup with her primary care practitioner and primary rheumatologist for further continued care.  Active Problems:  Fever -This was low-grade, because of the fact that she was on Humira and is considered immunocompromised she was monitored in the hospital for 48 hours. Her urine and blood cultures are negative to date. Chest x-ray was negative for any pneumonia. She has been afebrile overnight, her tachycardia has mostly resolved. She was not started on any antibiotics during her inpatient hospital stay.   RA (rheumatoid arthritis) -Continue with Humira at the discretion of her rheumatologist. Pain continues to be a major issue for this patient, during this hospital course we have placed her on 1000 mg of Tylenol 3 times a day and have used Ultram for breakthrough pain. She seems to tolerate Ultram very well. I have also spoken with her daughter over the phone (Ms Laural Benes) have asked her to avoid nonsteroidal anti-inflammatory medications because of recent history of GI bleeding and to see if he can avoid narcotics as she seems to be intolerant to them. -She will followup with her primary rheumatologist for further continued care   Diabetes -Continue with metformin on discharge   Recent GI bleed due to NSAIDs -Continue PPI -Hemoglobin and hematocrit were stable this admission -No signs of GI bleed this admission  TODAY-DAY OF DISCHARGE:  Subjective:   Fleet Contras today has no headache,no chest abdominal pain,no new weakness tingling or numbness, feels much better wants to go  home today. Arthritic pain is significantly better today. She has not had a fever overnight and all cultures are negative to date.  Objective:   Blood pressure 121/73, pulse 102, temperature 98.4 F (36.9 C), temperature source Oral, resp. rate 20, height 5\' 5"  (1.651 m), weight 71 kg (156 lb  8.4 oz), SpO2 97.00%.  Intake/Output Summary (Last 24 hours) at 05/20/12 1129 Last data filed at 05/19/12 1747  Gross per 24 hour  Intake    240 ml  Output    600 ml  Net   -360 ml    Exam Awake Alert, Oriented *3, No new F.N deficits, Normal affect [place].AT,PERRAL Supple Neck,No JVD, No cervical lymphadenopathy appriciated.  Symmetrical Chest wall movement, Good air movement bilaterally, CTAB RRR,No Gallops,Rubs or new Murmurs, No Parasternal Heave +ve B.Sounds, Abd Soft, Non tender, No organomegaly appriciated, No rebound -guarding or rigidity. No Cyanosis, Clubbing or edema, No new Rash or bruise  DISCHARGE CONDITION: Stable  DISPOSITION: HOME  DISCHARGE INSTRUCTIONS:    Activity:  As tolerated with Full fall precautions use walker/cane & assistance as needed  Diet recommendation: Diabetic Diet Heart Healthy diet      Follow-up Information    Follow up with Kathyrn Lass, MD. Schedule an appointment as soon as possible for a visit in 1 week.   Contact information:   [address] [facility] [place] [address] [phone]       Follow up with Lenna Sciara, MD. Schedule an appointment as soon as possible for a visit in 1 week.   Contact information:   [address] [place] [address] [phone]         Total Time spent on discharge equals 45 minutes.  SignedJeoffrey Massed 05/20/2012 11:29 AM

## 2012-05-20 NOTE — Care Management Note (Signed)
    Page 1 of 2   05/20/2012     1:02:19 PM   CARE MANAGEMENT NOTE 05/20/2012  Patient:  Theresa Hawkins,Theresa Hawkins   Account Number:  [id number]  Date Initiated:  05/20/2012  Documentation initiated by:  Letha Cape  Subjective/Objective Assessment:   dx weakness, RA, confused, vomiting,  admit- lives alone , but will stay with daughter for a while.     Action/Plan:   pt eval rec hhpt   Anticipated DC Date:  05/20/2012   Anticipated DC Plan:  HOME W HOME HEALTH SERVICES      DC Planning Services  CM consult      [facility] Choice  HOME HEALTH   Choice offered to / List presented to:  C-4 Adult Children        HH arranged  HH-2 PT  HH-4 NURSE'S AIDE      HH agency  [facility]   Status of service:  Completed, signed off Medicare Important Message given?   (If response is "NO", the following Medicare IM given date fields will be blank) Date Medicare IM given:   Date Additional Medicare IM given:    Discharge Disposition:  HOME W HOME HEALTH SERVICES  Per UR Regulation:  Reviewed for med. necessity/level of care/duration of stay  If discussed at Long Length of Stay Meetings, dates discussed:    Comments:  05/20/12 12:59 Letha Cape RN, BSN [phone] patient lives alone, but will be staying with her daughter for a while, she wants a hh aide as well along with hhpt. Daughter chose [facility] for [facility] services for hhpt and aide, referral made to [facility], Hilda Lias notified.  Soc will begin 24-48 hrs post discharge.

## 2012-05-20 NOTE — Progress Notes (Signed)
Patient discharge instruction reviewed brieifly with daughter Lucille Passy.  Daughter verbalized understandning and patient stated she was oK  With instruction being give to daughter. IV removed Cath tip intact

## 2012-05-24 LAB — CULTURE, BLOOD (ROUTINE X 2): Culture: NO GROWTH

## 2014-05-03 ENCOUNTER — Emergency Department (HOSPITAL_COMMUNITY): Payer: Medicare Other

## 2014-05-03 ENCOUNTER — Observation Stay (HOSPITAL_COMMUNITY)
Admission: EM | Admit: 2014-05-03 | Discharge: 2014-05-04 | Disposition: A | Discharge status: Home / Self Care | Payer: Medicare Other | Attending: Internal Medicine | Admitting: Internal Medicine

## 2014-05-03 ENCOUNTER — Encounter (HOSPITAL_COMMUNITY): Payer: Self-pay | Admitting: Emergency Medicine

## 2014-05-03 DIAGNOSIS — Z79899 Other long term (current) drug therapy: Secondary | ICD-10-CM | POA: Diagnosis not present

## 2014-05-03 DIAGNOSIS — K219 Gastro-esophageal reflux disease without esophagitis: Secondary | ICD-10-CM | POA: Diagnosis not present

## 2014-05-03 DIAGNOSIS — I10 Essential (primary) hypertension: Secondary | ICD-10-CM | POA: Insufficient documentation

## 2014-05-03 DIAGNOSIS — E119 Type 2 diabetes mellitus without complications: Secondary | ICD-10-CM | POA: Diagnosis present

## 2014-05-03 DIAGNOSIS — Z86718 Personal history of other venous thrombosis and embolism: Secondary | ICD-10-CM | POA: Diagnosis not present

## 2014-05-03 DIAGNOSIS — Z87891 Personal history of nicotine dependence: Secondary | ICD-10-CM | POA: Diagnosis not present

## 2014-05-03 DIAGNOSIS — F411 Generalized anxiety disorder: Secondary | ICD-10-CM | POA: Diagnosis not present

## 2014-05-03 DIAGNOSIS — M069 Rheumatoid arthritis, unspecified: Secondary | ICD-10-CM | POA: Diagnosis not present

## 2014-05-03 DIAGNOSIS — Z8673 Personal history of transient ischemic attack (TIA), and cerebral infarction without residual deficits: Secondary | ICD-10-CM | POA: Insufficient documentation

## 2014-05-03 DIAGNOSIS — I517 Cardiomegaly: Secondary | ICD-10-CM | POA: Diagnosis not present

## 2014-05-03 DIAGNOSIS — I509 Heart failure, unspecified: Secondary | ICD-10-CM | POA: Insufficient documentation

## 2014-05-03 DIAGNOSIS — F3289 Other specified depressive episodes: Secondary | ICD-10-CM | POA: Diagnosis not present

## 2014-05-03 DIAGNOSIS — Z888 Allergy status to other drugs, medicaments and biological substances status: Secondary | ICD-10-CM | POA: Insufficient documentation

## 2014-05-03 DIAGNOSIS — E785 Hyperlipidemia, unspecified: Secondary | ICD-10-CM | POA: Diagnosis not present

## 2014-05-03 DIAGNOSIS — F329 Major depressive disorder, single episode, unspecified: Secondary | ICD-10-CM | POA: Insufficient documentation

## 2014-05-03 DIAGNOSIS — R0789 Other chest pain: Secondary | ICD-10-CM | POA: Diagnosis present

## 2014-05-03 DIAGNOSIS — Z885 Allergy status to narcotic agent status: Secondary | ICD-10-CM | POA: Diagnosis not present

## 2014-05-03 DIAGNOSIS — R079 Chest pain, unspecified: Secondary | ICD-10-CM | POA: Diagnosis present

## 2014-05-03 LAB — D-DIMER, QUANTITATIVE: D-Dimer, Quant: 11.09 ug/mL-FEU — ABNORMAL HIGH (ref 0.00–0.48)

## 2014-05-03 LAB — CBC
HCT: 33.9 % — ABNORMAL LOW (ref 36.0–46.0)
HEMOGLOBIN: 11.1 g/dL — AB (ref 12.0–15.0)
MCH: 29.9 pg (ref 26.0–34.0)
MCHC: 32.7 g/dL (ref 30.0–36.0)
MCV: 91.4 fL (ref 78.0–100.0)
Platelets: 283 10*3/uL (ref 150–400)
RBC: 3.71 MIL/uL — ABNORMAL LOW (ref 3.87–5.11)
RDW: 13.2 % (ref 11.5–15.5)
WBC: 9.7 10*3/uL (ref 4.0–10.5)

## 2014-05-03 LAB — BASIC METABOLIC PANEL
Anion gap: 13 (ref 5–15)
BUN: 13 mg/dL (ref 6–23)
CO2: 25 mEq/L (ref 19–32)
Calcium: 9 mg/dL (ref 8.4–10.5)
Chloride: 96 mEq/L (ref 96–112)
Creatinine, Ser: 0.68 mg/dL (ref 0.50–1.10)
GFR, EST NON AFRICAN AMERICAN: 83 mL/min — AB (ref >90)
GLUCOSE: 202 mg/dL — AB (ref 70–99)
POTASSIUM: 3.9 meq/L (ref 3.7–5.3)
SODIUM: 134 meq/L — AB (ref 137–147)

## 2014-05-03 LAB — I-STAT TROPONIN, ED: TROPONIN I, POC: 0 ng/mL (ref 0.00–0.08)

## 2014-05-03 LAB — TROPONIN I: Troponin I: <0.3 ng/mL (ref <0.30)

## 2014-05-03 LAB — CBG MONITORING, ED: Glucose-Capillary: 103 mg/dL — ABNORMAL HIGH (ref 70–99)

## 2014-05-03 LAB — PRO B NATRIURETIC PEPTIDE: Pro B Natriuretic peptide (BNP): 459.9 pg/mL — ABNORMAL HIGH (ref 0–450)

## 2014-05-03 MED ORDER — CARVEDILOL 12.5 MG PO TABS
12.5000 mg | ORAL_TABLET | Freq: Two times a day (BID) | ORAL | Status: DC
  Filled 2014-05-03 (×3): qty 1

## 2014-05-03 MED ORDER — IOHEXOL 350 MG/ML SOLN
100.0000 mL | Freq: Once | INTRAVENOUS | Status: AC | PRN
  Administered 2014-05-03: 100 mL via INTRAVENOUS

## 2014-05-03 MED ORDER — NITROGLYCERIN 0.4 MG SL SUBL: 0.4000 mg | SUBLINGUAL_TABLET | SUBLINGUAL | Status: DC | PRN

## 2014-05-03 MED ORDER — PANTOPRAZOLE SODIUM 40 MG PO TBEC: 40.0000 mg | DELAYED_RELEASE_TABLET | Freq: Every day | ORAL | Status: DC | PRN

## 2014-05-03 MED ORDER — ACETAMINOPHEN 325 MG PO TABS
650.0000 mg | ORAL_TABLET | ORAL | Status: DC | PRN
  Administered 2014-05-04: 650 mg via ORAL
  Filled 2014-05-03: qty 2

## 2014-05-03 MED ORDER — ATORVASTATIN CALCIUM 10 MG PO TABS
10.0000 mg | ORAL_TABLET | Freq: Every day | ORAL | Status: DC
  Administered 2014-05-03 – 2014-05-04 (×2): 10 mg via ORAL
  Filled 2014-05-03 (×2): qty 1

## 2014-05-03 MED ORDER — SODIUM CHLORIDE 0.9 % IV BOLUS (SEPSIS)
500.0000 mL | Freq: Once | INTRAVENOUS | Status: AC
  Administered 2014-05-03: 500 mL via INTRAVENOUS

## 2014-05-03 MED ORDER — ASPIRIN 81 MG PO CHEW
324.0000 mg | CHEWABLE_TABLET | Freq: Once | ORAL | Status: AC
  Administered 2014-05-03: 324 mg via ORAL
  Filled 2014-05-03: qty 4

## 2014-05-03 MED ORDER — INSULIN ASPART 100 UNIT/ML ~~LOC~~ SOLN: 0.0000 [IU] | Freq: Three times a day (TID) | SUBCUTANEOUS | Status: DC

## 2014-05-03 MED ORDER — SODIUM CHLORIDE 0.9 % IJ SOLN
3.0000 mL | Freq: Two times a day (BID) | INTRAMUSCULAR | Status: DC
  Administered 2014-05-03 – 2014-05-04 (×2): 3 mL via INTRAVENOUS

## 2014-05-03 MED ORDER — ENOXAPARIN SODIUM 40 MG/0.4ML ~~LOC~~ SOLN
40.0000 mg | SUBCUTANEOUS | Status: DC
  Filled 2014-05-03 (×2): qty 0.4

## 2014-05-03 MED ORDER — IPRATROPIUM-ALBUTEROL 0.5-2.5 (3) MG/3ML IN SOLN
3.0000 mL | Freq: Once | RESPIRATORY_TRACT | Status: AC
  Administered 2014-05-03: 3 mL via RESPIRATORY_TRACT
  Filled 2014-05-03: qty 3

## 2014-05-03 MED ORDER — ACETAMINOPHEN 500 MG PO TABS
1000.0000 mg | ORAL_TABLET | Freq: Once | ORAL | Status: AC
  Administered 2014-05-03: 1000 mg via ORAL
  Filled 2014-05-03: qty 2

## 2014-05-03 NOTE — ED Notes (Signed)
Patient transported to X-ray 

## 2014-05-03 NOTE — ED Notes (Signed)
Pt states this morning had central chest pain at home that resolved, while getting hair done after she started feeling dizzy like she was going to pass out, states then she vomited x 3, pt states "I feel sick", pt states she also feels short of breath, pt currently on 4 L [place] at 90%, does not usually wear oxygen at home, pt states just now started having sharp centralized chest pain again.

## 2014-05-03 NOTE — ED Provider Notes (Signed)
TIME SEEN: 1:26 PM  CHIEF COMPLAINT: Chest pain, shortness of breath, nausea and vomiting, dizziness  HPI: Patient is a 78 y.o. F with history of hypertension, diabetes, hyperlipidemia, prior upper extremity DVT no longer on anticoagulation after she had a GI bleed, rheumatoid arthritis, prior stroke who presents emergency department with chest pain, shortness of breath, dizziness, nausea and vomiting that started while at rest today while she was getting her hair done. She states that she felt like she may pass out. She vomited 3 times. She describes the pain in his or her chest as a pressure. No radiation. Aggravating or relieving factors. She states her last stress test was many years ago. She has never had a cardiac catheterization. Denies a history of PE or DVT. No calf pain or swelling. No fevers or cough. She states she was a smoker in her 30s but does not use tobacco products now. Does on oxygen at home. No history of COPD or asthma.  ROS: See HPI Constitutional: no fever  Eyes: no drainage  ENT: no runny nose   Cardiovascular:  chest pain  Resp:  SOB  GI:  vomiting GU: no dysuria Integumentary: no rash  Allergy: no hives  Musculoskeletal: no leg swelling  Neurological: no slurred speech ROS otherwise negative  PAST MEDICAL HISTORY/PAST SURGICAL HISTORY:  Past Medical History  Diagnosis Date  . Hypertension   . Sepsis   . Syncope, near 05/18/2012  . GI bleed due to NSAIDs 04/2012    hospitalized  . H/O hiatal hernia   . High cholesterol   . CHF (congestive heart failure)   . DVT of upper extremity (deep vein thrombosis) 2011    "after a surgery"  . Type II diabetes mellitus   . History of blood transfusion 2011    "post Coumadin administered for DVT, got 6 units PRBCs cause she was bleeing out in her stools"  . GERD (gastroesophageal reflux disease)   . Rheumatoid arthritis(714.0)     "all over" (05/18/2012)  . Stroke 2011    "found old mini strokes on CAT scan"  (05/18/2012)  . Fibromyalgia     "questionable" (05/18/2012)  . Chronic pain syndrome   . Anxiety   . Depression     MEDICATIONS:  Prior to Admission medications   Medication Sig Start Date End Date Taking? Authorizing Provider  acetaminophen (TYLENOL) 500 MG tablet Take 1,000 mg by mouth every 6 (six) hours as needed. For pain   Yes Historical Provider, MD  atorvastatin (LIPITOR) 10 MG tablet Take 10 mg by mouth daily.   Yes Historical Provider, MD  carvedilol (COREG) 12.5 MG tablet Take 12.5 mg by mouth 2 (two) times daily with a meal.    Yes Historical Provider, MD  Cyanocobalamin (B-12 PO) Take 1 tablet by mouth daily.   Yes Historical Provider, MD  Golimumab (SIMPONI) 50 MG/0.5ML SOAJ Inject 50 mg into the skin every 30 (thirty) days.   Yes Historical Provider, MD  ibuprofen (ADVIL,MOTRIN) 200 MG tablet Take 800 mg by mouth 2 (two) times daily.   Yes Historical Provider, MD  methotrexate (RHEUMATREX) 2.5 MG tablet Take 12.5 mg by mouth once a week. Takes 5 x 2.5mg  tablets every Tuesday. Caution:Chemotherapy. Protect from light.   Yes Historical Provider, MD  Multiple Vitamin (MULTIVITAMIN WITH MINERALS) TABS Take 1 tablet by mouth daily.   Yes Historical Provider, MD  pantoprazole (PROTONIX) 40 MG tablet Take 40 mg by mouth daily as needed (indigestion).    Yes Historical  Provider, MD    ALLERGIES:  Allergies  Allergen Reactions  . Codeine Nausea And Vomiting  . Prevnar [Pneumococcal 13-Val Conj Vacc]     Made her feel terrible and short of breath.    SOCIAL HISTORY:  History  Substance Use Topics  . Smoking status: Former Smoker -- 43 years    Types: Cigarettes  . Smokeless tobacco: Never Used     Comment: 05/18/2012 "stopped smoking > 7 yrs ago; smoked occasionally til husband died then 1 ppd for ~ 3 - 4 months"  . Alcohol Use: No    FAMILY HISTORY: No family history on file.  EXAM: BP 93/58  Pulse 74  Temp(Src) 97.6 F (36.4 C) (Oral)  Resp 16  SpO2  93% CONSTITUTIONAL: Alert and oriented and responds appropriately to questions. Well-appearing; well-nourished HEAD: Normocephalic EYES: Conjunctivae clear, PERRL ENT: normal nose; no rhinorrhea; moist mucous membranes; pharynx without lesions noted NECK: Supple, no meningismus, no LAD  CARD: RRR; S1 and S2 appreciated; no murmurs, no clicks, no rubs, no gallops RESP: Normal chest excursion without splinting or tachypnea; breath sounds equal bilaterally; no wheezes, mild scattered rhonchi, no rales, chest wall is mildly tender to palpation but this does not reproduced her pain, no crepitus or ecchymosis or deformity ABD/GI: Normal bowel sounds; non-distended; soft, non-tender, no rebound, no guarding BACK:  The back appears normal and is non-tender to palpation, there is no CVA tenderness EXT: Normal ROM in all joints; non-tender to palpation; no edema; normal capillary refill; no cyanosis, no calf tenderness, equal pulses in all 4 extremities    SKIN: Normal color for age and race; warm NEURO: Moves all extremities equally PSYCH: The patient's mood and manner are appropriate. Grooming and personal hygiene are appropriate.  MEDICAL DECISION MAKING: Patient here chest pain, shortness of breath, dizziness, nausea and vomiting. She initially had an oxygen saturation of 78% on room air. She is on 4 L nasal cannula and doing well. She does have some scattered rhonchi. We'll give 1 DuoNeb treatment. Concern for possible PE versus ACS versus less likely dissection. Will obtain cardiac labs including BNP, d-dimer. Will give aspirin. We'll obtain chest x-ray. Patient will need admission.  ED PROGRESS: Patient's labs are unremarkable other than elevated d-dimer. Will obtain a CT of her chest. Chest x-ray clear. BNP less than 500. Troponin negative. EKG shows no new ischemic changes compared to prior.     CT chest shows no pulmonary embolus, edema or infiltrate. We have weaned her off oxygen and she is  able to angulate with her sats staying above 95%. Unclear etiology for episode of hypoxia. Nursing staff reports there was a good waveform during this episode and it was prolonged. Given patient does have multiple risk factors for ACS however, I feel she needs admission for chest pain rule out. Discussed with Dr. Elvera Lennox who agrees with admission to telemetry, observation.      EKG Interpretation  Date/Time:  Wednesday May 03 2014 12:28:44 EDT Ventricular Rate:  60 PR Interval:  185 QRS Duration: 82 QT Interval:  468 QTC Calculation: 468 R Axis:   20 Text Interpretation:  Sinus rhythm Left atrial enlargement Probable anteroseptal infarct, old No significant change since last tracing Confirmed by Charlesetta Milliron,  DO, [facility] [phone]) on 05/03/2014 1:27:56 PM        Layla Maw Mikka Kissner, DO 05/03/14 1554

## 2014-05-03 NOTE — ED Notes (Signed)
Patient transported to CT 

## 2014-05-03 NOTE — Consult Note (Signed)
CARDIOLOGY CONSULT NOTE   Patient ID: Theresa Hawkins MRN: [id number] DOB/AGE: 1936-05-12 79 y.o.  Admit date: 05/03/2014  Primary Physician   Kathyrn Lass, MD Primary Cardiologist   Cardiologist in [place] has retired Reason for Consultation   Chest pain  Theresa Hawkins is a 78 y.o. female with no history of CAD.  She had a stress test approximately 10 years ago that was OK. She thinks she was told she had atrial fibrillation and put on Coreg. She never gets palpitations. She had been on coumadin for a DVT in her arm, but had a GI bleed, requiring transfusions and was taken off the coumadin.    Pt not very active at baseline, due to her RA. She has chronic complaints of pain, especially in her left knee (needs another replacement).  She also complains of general malaise and fatigue. She gets SOB with stairs and has problems walking a block without stopping. She has a history of pedal edema, worse when she does not elevate her legs. She never gets chest pain, with or without activity.  She was in her usual state of health today when she went to the hairdresser. She was leaning back for while at the shampoo ball and then got up and walked over to the chair. When her hair was about half done being rolled, she started feeling like she was going to pass out. She asked the hairdresser to hurry and leaned forward, which made her feel a little bit better. She then developed nausea and shortness of breath. She vomited x1. During this period of time she had a brief episode of chest pain that lasted about 5 seconds. After the vomiting, she continued to feel presyncope, felt a little better with her eyes closed, but denies dizziness. EMS was called and transported her. The presyncope did not initially improve, but the nausea improved with medication. In the ER, she has had ASA 324 mg, NS 500 cc, and now feels at her baseline. She never had palpitations.   Past Medical History  Diagnosis Date  .  Hypertension   . Sepsis   . Syncope, near 05/18/2012  . GI bleed due to NSAIDs 04/2012    hospitalized  . H/O hiatal hernia   . High cholesterol   . CHF (congestive heart failure)   . DVT of upper extremity (deep vein thrombosis) 2011    after a PICC line  . Type II diabetes mellitus   . History of blood transfusion 2011    "post Coumadin administered for DVT, got 6 units PRBCs cause she was bleeing out in her stools"  . GERD (gastroesophageal reflux disease)   . Rheumatoid arthritis(714.0)     "all over" (05/18/2012)  . Stroke 2011    "found old mini strokes on CAT scan" (05/18/2012)  . Fibromyalgia     "questionable" (05/18/2012)  . Chronic pain syndrome   . Anxiety   . Depression      Past Surgical History  Procedure Laterality Date  . Anterior cervical discectomy  01/2010  . Posterior laminectomy / decompression cervical spine  03/2010  . Total knee arthroplasty  ~ 2001    right; "found broken bone in leg during OR" (05/18/2012)  . Vaginal hysterectomy  1970's  . Joint replacement      Allergies  Allergen Reactions  . Codeine Nausea And Vomiting  . Prevnar [Pneumococcal 13-Val Conj Vacc]     Made her feel terrible and short of breath.  I have reviewed the patient's current medications . enoxaparin (LOVENOX) injection  40 mg Subcutaneous Q24H  . insulin aspart  0-9 Units Subcutaneous TID WC  . sodium chloride  3 mL Intravenous Q12H     nitroGLYCERIN  Medication Sig  acetaminophen (TYLENOL) 500 MG tablet Take 1,000 mg by mouth every 6 (six) hours as needed. For pain  atorvastatin (LIPITOR) 10 MG tablet Take 10 mg by mouth daily.  carvedilol (COREG) 12.5 MG tablet Take 12.5 mg by mouth 2 (two) times daily with a meal.   Cyanocobalamin (B-12 PO) Take 1 tablet by mouth daily.  Golimumab (SIMPONI) 50 MG/0.5ML SOAJ Inject 50 mg into the skin every 30 (thirty) days.  ibuprofen (ADVIL,MOTRIN) 200 MG tablet Take 800 mg by mouth 2 (two) times daily.  methotrexate  (RHEUMATREX) 2.5 MG tablet Take 12.5 mg by mouth once a week. Takes 5 x 2.5mg  tablets every Tuesday. Caution:Chemotherapy. Protect from light.  Multiple Vitamin (MULTIVITAMIN WITH MINERALS) TABS Take 1 tablet by mouth daily.  pantoprazole (PROTONIX) 40 MG tablet Take 40 mg by mouth daily as needed (indigestion).      History   Social History  . Marital Status: Unknown    Spouse Name: N/A    Number of Children: N/A  . Years of Education: N/A   Occupational History  . Retired    Social History Main Topics  . Smoking status: Former Smoker -- 43 years    Types: Cigarettes  . Smokeless tobacco: Never Used     Comment: 05/18/2012 - pt stated she stopped smoking > 7 yrs ago  . Alcohol Use: No  . Drug Use: No  . Sexual Activity: Not Currently   Other Topics Concern  . Not on file   Social History Narrative   Pt lives alone. Neither parent nor any siblings have had cardiac issues.    Family Status  Relation Status Death Age  . Mother Deceased   . Father Deceased    ROS:  Full 14 point review of systems complete and found to be negative unless listed above.  Physical Exam: Blood pressure 118/87, pulse 87, temperature 97.6 F (36.4 C), temperature source Oral, resp. rate 21, height 5\' 7"  (1.702 m), weight 157 lb (71.215 kg), SpO2 96.00%.  General: frail, elderly, female in no acute distress Head: Eyes PERRLA, No xanthomas.   Normocephalic and atraumatic, oropharynx without edema or exudate. Dentition: poor Lungs: Few rales, good air exchange Heart: HRRR S1 S2, no rub/gallop, no significant murmur. pulses are 2+ all 4 extrem.   Neck: No carotid bruits. No lymphadenopathy.  JVD not elevated. Abdomen: Bowel sounds present, abdomen soft and non-tender without masses or hernias noted. Msk:  No spine or cva tenderness. Generalized weakness, multiple joint deformities, no effusions. Extremities: No clubbing or cyanosis. No edema.  Neuro: Alert and oriented X 3. No focal deficits  noted. Psych:  Good affect, responds appropriately Skin: No rashes or lesions noted.  Labs:   Lab Results  Component Value Date   WBC 9.7 05/03/2014   HGB 11.1* 05/03/2014   HCT 33.9* 05/03/2014   MCV 91.4 05/03/2014   PLT 283 05/03/2014     Recent Labs Lab 05/03/14 1258  NA 134*  K 3.9  CL 96  CO2 25  BUN 13  CREATININE 0.68  CALCIUM 9.0  GLUCOSE 202*    Recent Labs  05/03/14 1309  TROPIPOC 0.00   Pro B Natriuretic peptide (BNP)  Date/Time Value Ref Range Status  05/03/2014 12:57 PM  459.9* 0 - 450 pg/mL Final   Lab Results  Component Value Date   DDIMER 11.09* 05/03/2014   ECG:  05/03/2014 Sinus rhythm, no acute ischemic changes Vent. rate 60 BPM PR interval 185 ms QRS duration 82 ms QT/QTc 468/468 ms P-R-T axes 56 20 45  Radiology:  Dg Chest 2 View 05/03/2014   CLINICAL DATA:  Chest pain, shortness of breath, nausea, vomiting, dizziness, history hypertension, diabetes, former smoker  EXAM: CHEST  2 VIEW  COMPARISON:  05/18/2012  FINDINGS: Enlargement of cardiac silhouette.  Atherosclerotic calcification aorta.  Mediastinal contours pulmonary vascularity normal.  Chronic peribronchial thickening without infiltrate, pleural effusion or pneumothorax.  Prior cervical spine fusion.  Bones demineralized.  IMPRESSION: Enlargement of cardiac silhouette.  No acute abnormalities.   Electronically Signed   By: Ulyses Southward M.D.   On: 05/03/2014 13:51   Ct Angio Chest W/cm &/or Wo Cm 05/03/2014   CLINICAL DATA:  Chest pain.  EXAM: CT ANGIOGRAPHY CHEST WITH CONTRAST  TECHNIQUE: Multidetector CT imaging of the chest was performed using the standard protocol during bolus administration of intravenous contrast. Multiplanar CT image reconstructions and MIPs were obtained to evaluate the vascular anatomy.  CONTRAST:  OMNIPAQUE IOHEXOL 350 MG/ML SOLN  COMPARISON:  Chest radiograph of same day.  FINDINGS: No pneumothorax or pleural effusion is noted. Minimal ill-defined opacity is  noted in the inferior portion of the left lower lobe consistent with subsegmental atelectasis or scarring. Right thyroid nodule or mass is noted. Atherosclerotic calcifications of abdominal aorta are noted without aneurysm or dissection. There is no evidence of pulmonary embolus. No mediastinal mass or adenopathy is noted. Right renal cyst is seen in the visualized portion of the upper abdomen. Severe degenerative change of the left glenohumeral joint is noted.  Review of the MIP images confirms the above findings.  IMPRESSION: No evidence of pulmonary embolus. Minimal subsegmental atelectasis or scarring is seen in left lung base. Large right thyroid nodule or mass is noted; dedicated thyroid ultrasound is recommended for further evaluation.   Electronically Signed   By: Roque Lias M.D.   On: 05/03/2014 14:56    ASSESSMENT AND PLAN:   The patient was seen today by Dr. Elease Hashimoto, the patient evaluated and the data reviewed.  Active Problems:   RA (rheumatoid arthritis) - per IM    Diabetes- per IM    Chest pain - brief and in the setting of presyncope associated with N&V. She does not routinely get chest pain. Agree with cycling enzymes and checking an echo. Would reserve full anticoagulation with heparin for elevated cardiac enzymes are newly abnormal ECG. Once the echocardiogram and cardiac enzymes are reviewed, a decision can be made on further ischemic testing.    Elevated d-dimer - CT of the chest was negative for PE. She has no signs of DVT in her upper or lower extremities by exam. Further evaluation per IM.  SignedTheodore Demark, PA-C 05/03/2014 5:30 PM Beeper [phone]  Co-Sign MD   Attending Note:   The patient was seen and examined.  Agree with assessment and plan as noted above.  Changes made to the above note as needed.  Pt presents with CP.  Associated with nausea.  Will get an echo. Check troponin levels   She should be able to go home soon if work up is negative.  Vesta Mixer, Montez Hageman., MD, [facility] 05/04/2014, 8:43 AM [address]  [address] Office [phone] Pager [phone]

## 2014-05-03 NOTE — H&P (Signed)
History and Physical   Nairi Rominger DEY:[id number] DOB: 02/13/1936 DOA: 05/03/2014  Referring physician: Dr. Elesa Massed PCP: Kathyrn Lass, MD  Specialists: cardiology   Chief Complaint: chest pain  HPI: Theresa Hawkins is a 78 y.o. female has a past medical history significant for DM, HTN, HLD, RA, presents to the ER with a chief complaint of chest pain. 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 She then experienced substernal chest pain, dull in quality, associated with nausea and vomiting. Her pain lasted less than 15 minutes. Denies prior episodes. At baseline she is not very active but can tell me that she gets significantly short of breath on going up a flight of stairs. She has a history of GERD and has had reflux type symptoms but this pain was different. No recent fever and chills, denies any lightheadedness or dizziness. Given history of DVT she had a D dimer checked, was elevated and she underwent a CTPA which was negative for PE. Initial troponin was negative and EKG without overt ischemic features. TRH asked for admission  Review of Systems: as per HPI otherwise negative  Past Medical History  Diagnosis Date  . Hypertension   . Sepsis   . Syncope, near 05/18/2012  . GI bleed due to NSAIDs 04/2012    hospitalized  . H/O hiatal hernia   . High cholesterol   . CHF (congestive heart failure)   . DVT of upper extremity (deep vein thrombosis) 2011    "after a surgery"  . Type II diabetes mellitus   . History of blood transfusion 2011    "post Coumadin administered for DVT, got 6 units PRBCs cause she was bleeing out in her stools"  . GERD (gastroesophageal reflux disease)   . Rheumatoid arthritis(714.0)     "all over" (05/18/2012)  . Stroke 2011    "found old mini strokes on CAT scan" (05/18/2012)  . Fibromyalgia     "questionable" (05/18/2012)  . Chronic pain syndrome   . Anxiety     . Depression    Past Surgical History  Procedure Laterality Date  . Anterior cervical discectomy  01/2010  . Posterior laminectomy / decompression cervical spine  03/2010  . Total knee arthroplasty  ~ 2001    right; "found broken bone in leg during OR" (05/18/2012)  . Vaginal hysterectomy  1970's  . Joint replacement     Social History:  reports that she has quit smoking. Her smoking use included Cigarettes. She smoked 0.00 packs per day for 43 years. She has never used smokeless tobacco. She reports that she does not drink alcohol or use illicit drugs.  Allergies  Allergen Reactions  . Codeine Nausea And Vomiting  . Prevnar [Pneumococcal 13-Val Conj Vacc]     Made her feel terrible and short of breath.   History reviewed. No pertinent family history.  Prior to Admission medications   Medication Sig Start Date End Date Taking? Authorizing Provider  acetaminophen (TYLENOL) 500 MG tablet Take 1,000 mg by mouth every 6 (six) hours as needed. For pain   Yes Historical Provider, MD  atorvastatin (LIPITOR) 10 MG tablet Take 10 mg by mouth daily.   Yes Historical Provider, MD  carvedilol (COREG) 12.5 MG tablet Take 12.5 mg by mouth 2 (two) times daily with a meal.    Yes Historical Provider, MD  Cyanocobalamin (B-12 PO) Take 1 tablet by mouth daily.  Yes Historical Provider, MD  Golimumab (SIMPONI) 50 MG/0.5ML SOAJ Inject 50 mg into the skin every 30 (thirty) days.   Yes Historical Provider, MD  ibuprofen (ADVIL,MOTRIN) 200 MG tablet Take 800 mg by mouth 2 (two) times daily.   Yes Historical Provider, MD  methotrexate (RHEUMATREX) 2.5 MG tablet Take 12.5 mg by mouth once a week. Takes 5 x 2.5mg  tablets every Tuesday. Caution:Chemotherapy. Protect from light.   Yes Historical Provider, MD  Multiple Vitamin (MULTIVITAMIN WITH MINERALS) TABS Take 1 tablet by mouth daily.   Yes Historical Provider, MD  pantoprazole (PROTONIX) 40 MG tablet Take 40 mg by mouth daily as needed (indigestion).    Yes  Historical Provider, MD   Physical Exam: Filed Vitals:   05/03/14 1350 05/03/14 1400 05/03/14 1500 05/03/14 1520  BP: 98/64 119/53 129/75   Pulse: 74  90   Temp:      TempSrc:      Resp: 16 15 19    SpO2: 98%  99% 94%    General:  No apparent distress  Eyes: no scleral icterus  ENT: moist oropharynx  Neck: supple, no JVD  Cardiovascular: regular rate without MRG; 2+ peripheral pulses  Respiratory: CTA biL, no wheezing  Abdomen: soft, non tender to palpation, positive bowel sounds, no guarding, no rebound  Skin: no rashes  Musculoskeletal: no peripheral edema  Neurologic: CN 2-12 grossly intact, MS 5/5 in all 4  Labs on Admission:  Basic Metabolic Panel:  Recent Labs Lab 05/03/14 1258  NA 134*  K 3.9  CL 96  CO2 25  GLUCOSE 202*  BUN 13  CREATININE 0.68  CALCIUM 9.0   CBC:  Recent Labs Lab 05/03/14 1258  WBC 9.7  HGB 11.1*  HCT 33.9*  MCV 91.4  PLT 283   BNP (last 3 results)  Recent Labs  05/03/14 1257  PROBNP 459.9*   Radiological Exams on Admission: Dg Chest 2 View  05/03/2014   CLINICAL DATA:  Chest pain, shortness of breath, nausea, vomiting, dizziness, history hypertension, diabetes, former smoker  EXAM: CHEST  2 VIEW  COMPARISON:  05/18/2012  FINDINGS: Enlargement of cardiac silhouette.  Atherosclerotic calcification aorta.  Mediastinal contours pulmonary vascularity normal.  Chronic peribronchial thickening without infiltrate, pleural effusion or pneumothorax.  Prior cervical spine fusion.  Bones demineralized.  IMPRESSION: Enlargement of cardiac silhouette.  No acute abnormalities.   Electronically Signed   By: 07/18/2012 M.D.   On: 05/03/2014 13:51   Ct Angio Chest W/cm &/or Wo Cm  05/03/2014   CLINICAL DATA:  Chest pain.  EXAM: CT ANGIOGRAPHY CHEST WITH CONTRAST  TECHNIQUE: Multidetector CT imaging of the chest was performed using the standard protocol during bolus administration of intravenous contrast. Multiplanar CT image  reconstructions and MIPs were obtained to evaluate the vascular anatomy.  CONTRAST:  05/05/2014 OMNIPAQUE IOHEXOL 350 MG/ML SOLN  COMPARISON:  Chest radiograph of same day.  FINDINGS: No pneumothorax or pleural effusion is noted. Minimal ill-defined opacity is noted in the inferior portion of the left lower lobe consistent with subsegmental atelectasis or scarring. Right thyroid nodule or mass is noted. Atherosclerotic calcifications of abdominal aorta are noted without aneurysm or dissection. There is no evidence of pulmonary embolus. No mediastinal mass or adenopathy is noted. Right renal cyst is seen in the visualized portion of the upper abdomen. Severe degenerative change of the left glenohumeral joint is noted.  Review of the MIP images confirms the above findings.  IMPRESSION: No evidence of pulmonary embolus. Minimal subsegmental atelectasis  or scarring is seen in left lung base. Large right thyroid nodule or mass is noted; dedicated thyroid ultrasound is recommended for further evaluation.   Electronically Signed   By: Roque Lias M.D.   On: 05/03/2014 14:56    EKG: Independently reviewed.  Assessment/Plan Active Problems:   RA (rheumatoid arthritis)   Diabetes   Chest pain   Chest pain - with atypical and typical features, resolved in less than 15 minutes. She describes also progressive dyspnea on exertion and barely able to walk a flight of stairs. Obtain 2D echo. I see a history of CHF but do not see an echo in our system - history of DM, HTN, HLD and RA - cardiology consulted, may need a stress test - monitor on telemetry overnight, cycle CEs. - she had a stress test ~10 years ago, states that she had no symptoms but her primary MD wanted to get that done so that she can travel to [place].   RA - hold her immunosuppressants for now  DM - SSI, check A1C  ? Acute hypoxic respiratory failure - per report, on admission sats in the 70s on room air, resolved, on my evaluation satting mid90s  on room air. - monitor, O2 as needed   Diet: heart healthy Fluids: none  DVT Prophylaxis: Lovenox  Code Status: Full  Family Communication: daughter bedside  Disposition Plan: telemetry obs  Time spent: 28  Juandavid Dallman M. Elvera Lennox, MD [facility] Pager [phone]  If 7PM-7AM, please contact night-coverage [email or web address] Password [email or web address] 05/03/2014, 4:10 PM

## 2014-05-03 NOTE — ED Notes (Signed)
Bed: BS49 Expected date:  Expected time:  Means of arrival:  Comments: CP elderly, divert from cone, syncope

## 2014-05-03 NOTE — ED Notes (Signed)
Per EMS pt started having central chest pain around 0830 this morning, resolved at 0845 this morning, then was getting hair done and started to feel faint and vomited x 3, shortness of breath noted by EMS and pt was clammy, 12-lead normal, BP 120/68, CBG 209, HR 60, 20G R hand, 4 Zofran given IV by EMS.

## 2014-05-04 DIAGNOSIS — R0789 Other chest pain: Secondary | ICD-10-CM | POA: Diagnosis not present

## 2014-05-04 DIAGNOSIS — M069 Rheumatoid arthritis, unspecified: Secondary | ICD-10-CM

## 2014-05-04 LAB — CBC
HCT: 31.9 % — ABNORMAL LOW (ref 36.0–46.0)
Hemoglobin: 10.4 g/dL — ABNORMAL LOW (ref 12.0–15.0)
MCH: 30.1 pg (ref 26.0–34.0)
MCHC: 32.6 g/dL (ref 30.0–36.0)
MCV: 92.5 fL (ref 78.0–100.0)
PLATELETS: 272 10*3/uL (ref 150–400)
RBC: 3.45 MIL/uL — ABNORMAL LOW (ref 3.87–5.11)
RDW: 13.6 % (ref 11.5–15.5)
WBC: 7.6 10*3/uL (ref 4.0–10.5)

## 2014-05-04 LAB — BASIC METABOLIC PANEL
Anion gap: 11 (ref 5–15)
BUN: 11 mg/dL (ref 6–23)
CO2: 25 mEq/L (ref 19–32)
Calcium: 8.7 mg/dL (ref 8.4–10.5)
Chloride: 100 mEq/L (ref 96–112)
Creatinine, Ser: 0.63 mg/dL (ref 0.50–1.10)
GFR calc Af Amer: >90 mL/min (ref >90)
GFR, EST NON AFRICAN AMERICAN: 85 mL/min — AB (ref >90)
GLUCOSE: 96 mg/dL (ref 70–99)
Potassium: 3.9 mEq/L (ref 3.7–5.3)
SODIUM: 136 meq/L — AB (ref 137–147)

## 2014-05-04 LAB — TROPONIN I

## 2014-05-04 LAB — GLUCOSE, CAPILLARY
GLUCOSE-CAPILLARY: 106 mg/dL — AB (ref 70–99)
GLUCOSE-CAPILLARY: 162 mg/dL — AB (ref 70–99)
Glucose-Capillary: 94 mg/dL (ref 70–99)
Glucose-Capillary: 106 mg/dL — ABNORMAL HIGH (ref 70–99)

## 2014-05-04 LAB — HEMOGLOBIN A1C
Hgb A1c MFr Bld: 5.9 % — ABNORMAL HIGH (ref <5.7)
Mean Plasma Glucose: 123 mg/dL — ABNORMAL HIGH (ref <117)

## 2014-05-04 NOTE — Care Management Note (Signed)
    Page 1 of 1   05/04/2014     3:50:08 PM CARE MANAGEMENT NOTE 05/04/2014  Patient:  Theresa Hawkins,Theresa Hawkins   Account Number:  [id number]  Date Initiated:  05/04/2014  Documentation initiated by:  Lanier Clam  Subjective/Objective Assessment:   78 Y/O F ADMITTED W/CHEST PAIN.     Action/Plan:   FROM HOME.   Anticipated DC Date:  05/05/2014   Anticipated DC Plan:  HOME/SELF CARE      DC Planning Services  CM consult      Choice offered to / List presented to:             Status of service:  In process, will continue to follow Medicare Important Message given?   (If response is "NO", the following Medicare IM given date fields will be blank) Date Medicare IM given:   Medicare IM given by:   Date Additional Medicare IM given:   Additional Medicare IM given by:    Discharge Disposition:    Per UR Regulation:  Reviewed for med. necessity/level of care/duration of stay  If discussed at Long Length of Stay Meetings, dates discussed:    Comments:  05/04/14 Nels Munn RN,BSN NCM [phone] NO ANTICIPATED D/C NEEDS.

## 2014-05-04 NOTE — Progress Notes (Signed)
  Echocardiogram 2D Echocardiogram has been performed.  Leta Jungling M 05/04/2014, 9:38 AM

## 2014-05-04 NOTE — Discharge Summary (Signed)
Physician Discharge Summary  Theresa Hawkins NAT:[id number] DOB: [birth year] DOA: 05/03/2014  PCP: Kathyrn Lass, MD  Admit date: 05/03/2014 Discharge date: 05/04/2014  Time spent: 35 minutes  Recommendations for Outpatient Follow-up:  1. Follow up with PCP in 1-2 weeks  Recommendations: Outpatient GI follow up  Discharge Diagnoses:  Active Problems:   RA (rheumatoid arthritis)   Diabetes   Chest pain  Discharge Condition: stable  Diet recommendation: heart healthy  Filed Weights   05/03/14 1640  Weight: 71.215 kg (157 lb)    History of present illness:  Theresa Hawkins is a 78 y.o. female has a past medical history significant for DM, HTN, HLD, RA, presents to the ER with a chief complaint of chest pain. 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 She then experienced substernal chest pain, dull in quality, associated with nausea and vomiting. Her pain lasted less than 15 minutes. Denies prior episodes. At baseline she is not very active but can tell me that she gets significantly short of breath on going up a flight of stairs. She has a history of GERD and has had reflux type symptoms but this pain was different. No recent fever and chills, denies any lightheadedness or dizziness. Given history of DVT she had a D dimer checked, was elevated and she underwent a CTPA which was negative for PE. Initial troponin was negative and EKG without overt ischemic features. TRH asked for admission  Hospital Course:  Chest pain - with atypical and typical features, resolved in less than 15 minutes and prior to presentation. Patient was admitted to telemetry floor and her cardiac enzymes were cycled. Cardiology was consulted and recommended to obtain a 2D echo and cleared patient for discharge and felt that no stress testing is needed at this point.   All her chronic medical problems were stable and  no medications changes were made to her chronic regimen.   Procedures:  2D echo  Study Conclusions  - Left ventricle: The cavity size was normal. There was moderate concentric hypertrophy. Systolic function was vigorous. The estimated ejection fraction was in the range of 65% to 70%. Wall motion was normal; there were no regional wall motion abnormalities. Doppler parameters are consistent with abnormal left ventricular relaxation (grade 1 diastolic dysfunction). - Left atrium: The atrium was moderately dilated. - Pericardium, extracardiac: A small pericardial effusion was identified posterior to the heart.  Consultations:  Cardiology   Discharge Exam: Filed Vitals:   05/04/14 0600 05/04/14 0949 05/04/14 1357 05/04/14 1641  BP: 117/59 92/60 119/51 143/86  Pulse: 87 96 68 86  Temp: 98.3 F (36.8 C)  98.6 F (37 C) 98.2 F (36.8 C)  TempSrc: Oral  Oral Oral  Resp: 20  20 18   Height:      Weight:      SpO2: 96%  98% 100%   General: NAD Cardiovascular: RRR Respiratory: CTA biL  Discharge Instructions    Medication List         acetaminophen 500 MG tablet  Commonly known as:  TYLENOL  Take 1,000 mg by mouth every 6 (six) hours as needed. For pain     atorvastatin 10 MG tablet  Commonly known as:  LIPITOR  Take 10 mg by mouth daily.     B-12 PO  Take 1 tablet by mouth daily.     carvedilol 12.5 MG tablet  Commonly known as:  COREG  Take 12.5 mg by mouth 2 (two) times daily with a meal.     ibuprofen 200 MG tablet  Commonly known as:  ADVIL,MOTRIN  Take 800 mg by mouth 2 (two) times daily.     methotrexate 2.5 MG tablet  Commonly known as:  RHEUMATREX  - Take 12.5 mg by mouth once a week. Takes 5 x 2.5mg  tablets every Tuesday.  - Caution:Chemotherapy. Protect from light.     multivitamin with minerals Tabs tablet  Take 1 tablet by mouth daily.     pantoprazole 40 MG tablet  Commonly known as:  PROTONIX  Take 40 mg by mouth daily as needed (indigestion).      SIMPONI 50 MG/0.5ML Soaj  Generic drug:  Golimumab  Inject 50 mg into the skin every 30 (thirty) days.           Follow-up Information   Follow up with Kathyrn Lass, MD. Schedule an appointment as soon as possible for a visit in 2 weeks.   Specialty:  Family Medicine   Contact information:   [facility] [address] [place] [address] [phone]       The results of significant diagnostics from this hospitalization (including imaging, microbiology, ancillary and laboratory) are listed below for reference.    Significant Diagnostic Studies: Dg Chest 2 View  05/03/2014   CLINICAL DATA:  Chest pain, shortness of breath, nausea, vomiting, dizziness, history hypertension, diabetes, former smoker  EXAM: CHEST  2 VIEW  COMPARISON:  05/18/2012  FINDINGS: Enlargement of cardiac silhouette.  Atherosclerotic calcification aorta.  Mediastinal contours pulmonary vascularity normal.  Chronic peribronchial thickening without infiltrate, pleural effusion or pneumothorax.  Prior cervical spine fusion.  Bones demineralized.  IMPRESSION: Enlargement of cardiac silhouette.  No acute abnormalities.   Electronically Signed   By: Ulyses Southward M.D.   On: 05/03/2014 13:51   Ct Angio Chest W/cm &/or Wo Cm  05/03/2014   CLINICAL DATA:  Chest pain.  EXAM: CT ANGIOGRAPHY CHEST WITH CONTRAST  TECHNIQUE: Multidetector CT imaging of the chest was performed using the standard protocol during bolus administration of intravenous contrast. Multiplanar CT image reconstructions and MIPs were obtained to evaluate the vascular anatomy.  CONTRAST:  OMNIPAQUE IOHEXOL 350 MG/ML SOLN  COMPARISON:  Chest radiograph of same day.  FINDINGS: No pneumothorax or pleural effusion is noted. Minimal ill-defined opacity is noted in the inferior portion of the left lower lobe consistent with subsegmental atelectasis or scarring. Right thyroid nodule or mass is noted. Atherosclerotic calcifications of abdominal  aorta are noted without aneurysm or dissection. There is no evidence of pulmonary embolus. No mediastinal mass or adenopathy is noted. Right renal cyst is seen in the visualized portion of the upper abdomen. Severe degenerative change of the left glenohumeral joint is noted.  Review of the MIP images confirms the above findings.  IMPRESSION: No evidence of pulmonary embolus. Minimal subsegmental atelectasis or scarring is seen in left lung base. Large right thyroid nodule or mass is noted; dedicated thyroid ultrasound is recommended for further evaluation.   Electronically Signed   By: Roque Lias M.D.   On: 05/03/2014 14:56   Labs: Basic Metabolic Panel:  Recent Labs Lab 05/03/14 1258 05/04/14 0451  NA 134* 136*  K 3.9 3.9  CL 96 100  CO2 25 25  GLUCOSE 202* 96  BUN 13 11  CREATININE 0.68 0.63  CALCIUM 9.0 8.7   CBC:  Recent Labs Lab 05/03/14 1258 05/04/14 0451  WBC 9.7  7.6  HGB 11.1* 10.4*  HCT 33.9* 31.9*  MCV 91.4 92.5  PLT 283 272   Cardiac Enzymes:  Recent Labs Lab 05/03/14 1630 05/03/14 2155 05/04/14 0451  TROPONINI <0.30 <0.30 <0.30   BNP: BNP (last 3 results)  Recent Labs  05/03/14 1257  PROBNP 459.9*   CBG:  Recent Labs Lab 05/03/14 1701 05/04/14 0057 05/04/14 0819 05/04/14 1156 05/04/14 1400  GLUCAP 103* 106* 94 162* 106*    Signed:  GHERGHE, COSTIN  [facility] 05/04/2014, 6:24 PM

## 2014-05-04 NOTE — Progress Notes (Signed)
D/C instructions reviewed w/ pt and dtr, both verbalize understanding, all questions answered. Pt d/c in w/c in stable condition to dtr's car by NT. Pt in possession of d/c instructions and all personal belongings.

## 2014-05-04 NOTE — Progress Notes (Addendum)
Patient Name: Theresa Hawkins Date of Encounter: 05/04/2014  Active Problems:   RA (rheumatoid arthritis)   Diabetes   Chest pain   Length of Stay: 1  SUBJECTIVE  No more chest pain, the last one yesterday on arrival.   CURRENT MEDS . atorvastatin  10 mg Oral Daily  . carvedilol  12.5 mg Oral BID WC  . enoxaparin (LOVENOX) injection  40 mg Subcutaneous Q24H  . insulin aspart  0-9 Units Subcutaneous TID WC  . sodium chloride  3 mL Intravenous Q12H    OBJECTIVE  Filed Vitals:   05/03/14 1640 05/03/14 1800 05/03/14 2029 05/04/14 0600  BP:  133/68 108/62 117/59  Pulse:  83 99 87  Temp:  97.9 F (36.6 C) 98.4 F (36.9 C) 98.3 F (36.8 C)  TempSrc:  Oral Oral Oral  Resp:  20 20 20   Height: 5\' 7"  (1.702 m)     Weight: 157 lb (71.215 kg)     SpO2:  97% 98% 96%    Intake/Output Summary (Last 24 hours) at 05/04/14 0810 Last data filed at 05/03/14 2300  Gross per 24 hour  Intake    243 ml  Output      1 ml  Net    242 ml   Filed Weights   05/03/14 1640  Weight: 157 lb (71.215 kg)    PHYSICAL EXAM  General: Pleasant, NAD. Neuro: Alert and oriented X 3. Moves all extremities spontaneously. Psych: Normal affect. HEENT:  Normal  Neck: Supple without bruits or JVD. Lungs:  Resp regular and unlabored, CTA. Heart: RRR no s3, s4, or murmurs. Abdomen: Soft, non-tender, non-distended, BS + x 4.  Extremities: No clubbing, cyanosis or edema. DP/PT/Radials 2+ and equal bilaterally.  Accessory Clinical Findings  CBC  Recent Labs  05/03/14 1258 05/04/14 0451  WBC 9.7 7.6  HGB 11.1* 10.4*  HCT 33.9* 31.9*  MCV 91.4 92.5  PLT 283 272   Basic Metabolic Panel  Recent Labs  05/03/14 1258 05/04/14 0451  NA 134* 136*  K 3.9 3.9  CL 96 100  CO2 25 25  GLUCOSE 202* 96  BUN 13 11  CREATININE 0.68 0.63  CALCIUM 9.0 8.7   Cardiac Enzymes  Recent Labs  05/03/14 1630 05/03/14 2155 05/04/14 0451  TROPONINI <0.30 <0.30 <0.30   D-Dimer  Recent Labs  05/03/14 1258  DDIMER 11.09*   Hemoglobin A1C  Recent Labs  05/03/14 1257  HGBA1C 5.9*   Radiology/Studies  Dg Chest 2 View  05/03/2014   CLINICAL DATA:  Chest pain, shortness of breath, nausea, vomiting, dizziness, history hypertension, diabetes, former smoker  EXAM: CHEST  2 VIEW  COMPARISON:  05/18/2012  FINDINGS: Enlargement of cardiac silhouette.  Atherosclerotic calcification aorta.  Mediastinal contours pulmonary vascularity normal.  Chronic peribronchial thickening without infiltrate, pleural effusion or pneumothorax.  Prior cervical spine fusion.  Bones demineralized.  IMPRESSION: Enlargement of cardiac silhouette.  No acute abnormalities.   Electronically Signed   By: 05/05/2014 M.D.   On: 05/03/2014 13:51   Ct Angio Chest W/cm &/or Wo Cm  05/03/2014   CLINICAL DATA:  Chest pain.  IMPRESSION: No evidence of pulmonary embolus. Minimal subsegmental atelectasis or scarring is seen in left lung base. Large right thyroid nodule or mass is noted; dedicated thyroid ultrasound is recommended for further evaluation.   Electronically Signed     TELE: SR 80-90 BPM  ECG: SR, normal ECG    ASSESSMENT AND PLAN  78 year old female with  h/o rheumatoid arthritis who was admitted with an episode of postprandial chest pain. She states that her pain has been occuring in the last 2 months, always postprandial and not exertional. Her ECG is normal, troponin negative x 3.  If echocardiogram is normal, she can be dischraged. She will need a GI evaluation - possibly outpatient.  BP - controlled. HLP on atorvastatin.   Signed, Lars Masson MD, [facility] 05/04/2014  Study Conclusions  - Left ventricle: The cavity size was normal. There was moderate concentric hypertrophy. Systolic function was vigorous. The estimated ejection fraction was in the range of 65% to 70%. Wall motion was normal; there were no regional wall motion abnormalities. Doppler parameters are consistent with  abnormal left ventricular relaxation (grade 1 diastolic dysfunction). - Left atrium: The atrium was moderately dilated. - Pericardium, extracardiac: A small pericardial effusion was identified posterior to the heart.  Normal LVEF, no regional wall motion abnormalities. The patient can be discharged home. Outpatient follow up with GI.  Lars Masson 05/04/2014\

## 2014-05-04 NOTE — Progress Notes (Signed)
UR completed 

## 2014-05-04 NOTE — Discharge Instructions (Signed)

## 2016-05-22 IMAGING — CR DG CHEST 2V
2 series · 2 of 2 positions shown · non-contrast
Comparison: 05/18/2012

CLINICAL DATA: Chest pain, shortness of breath, nausea, vomiting,
dizziness, history hypertension, diabetes, former smoker

EXAM:
CHEST  2 VIEW

[w chest lat]
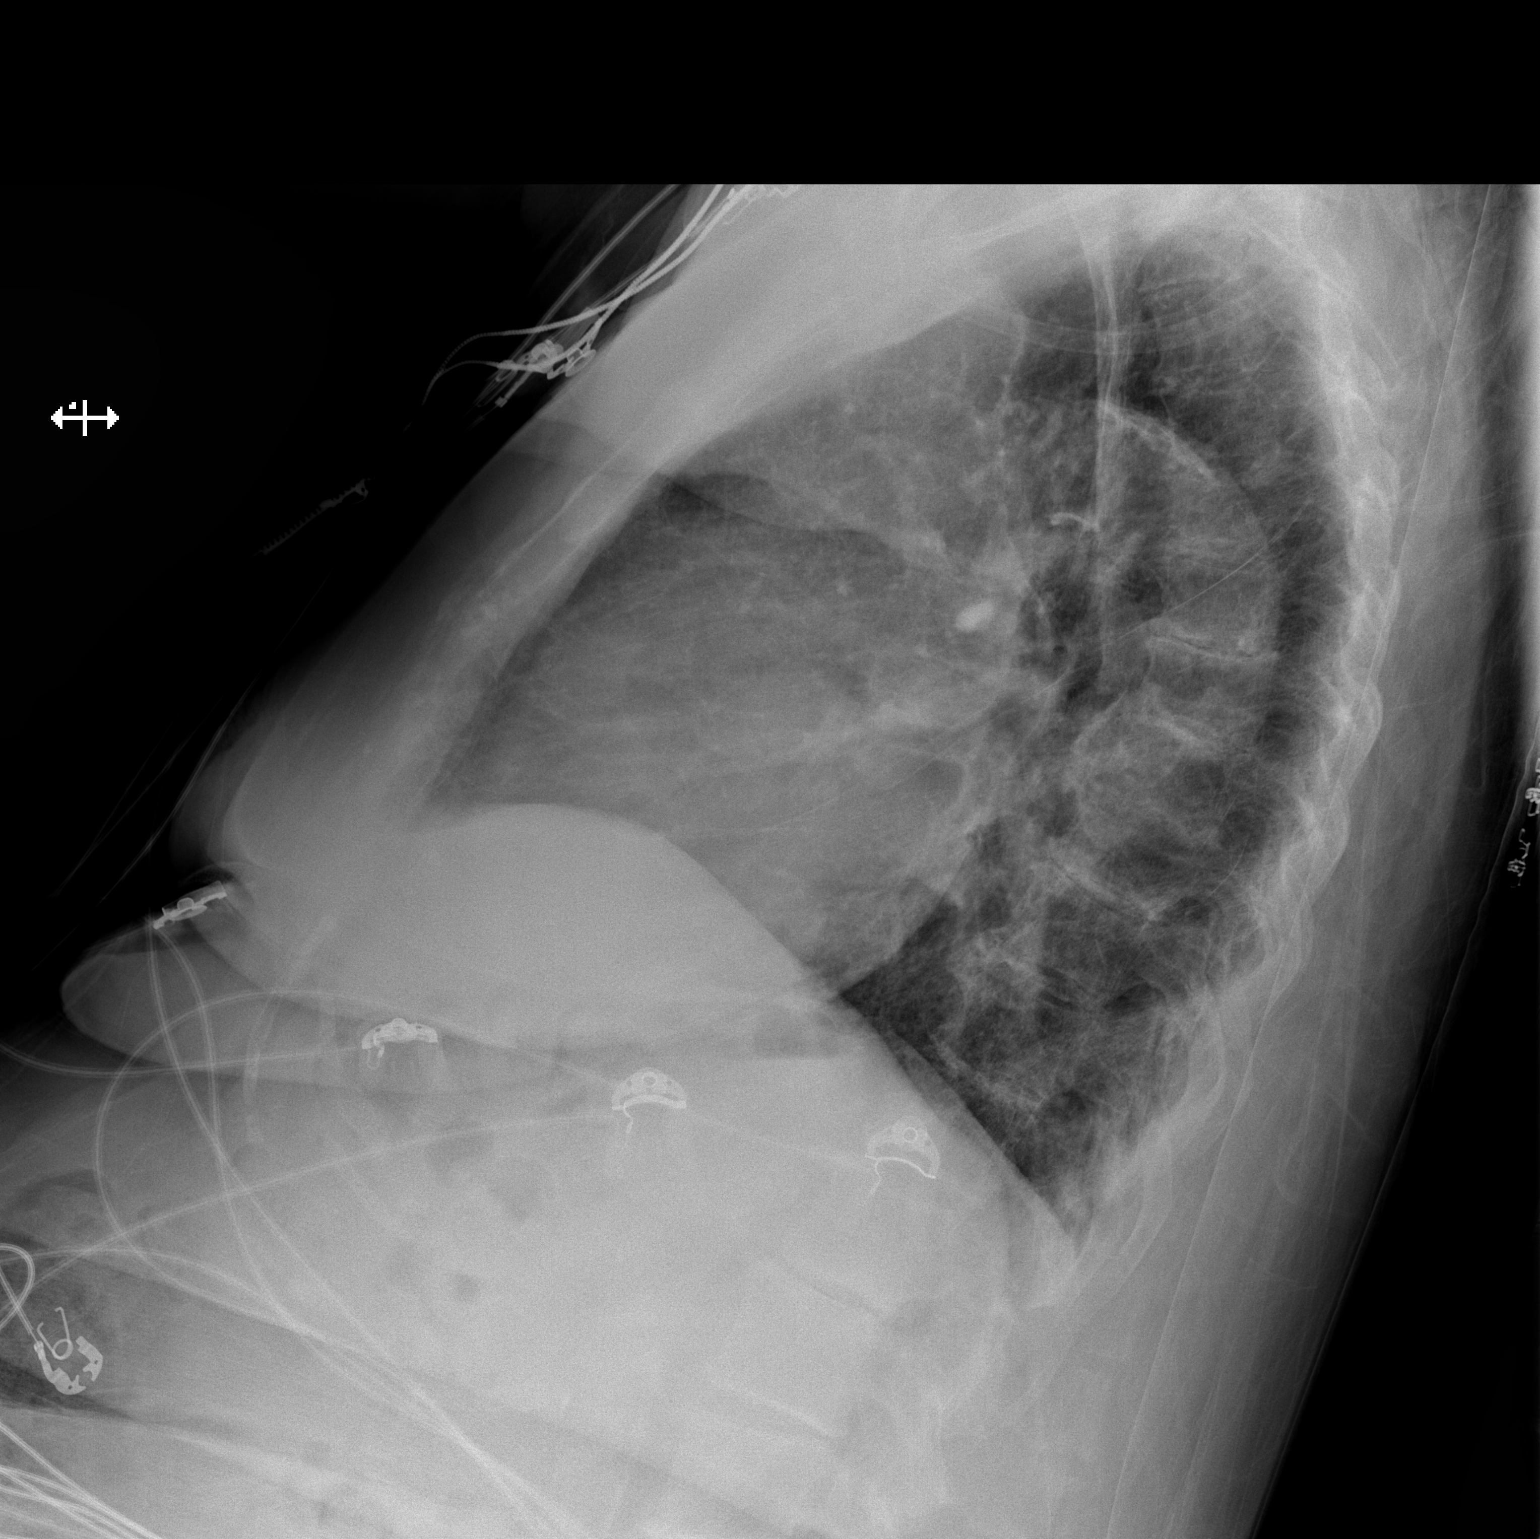

[x chest ap]
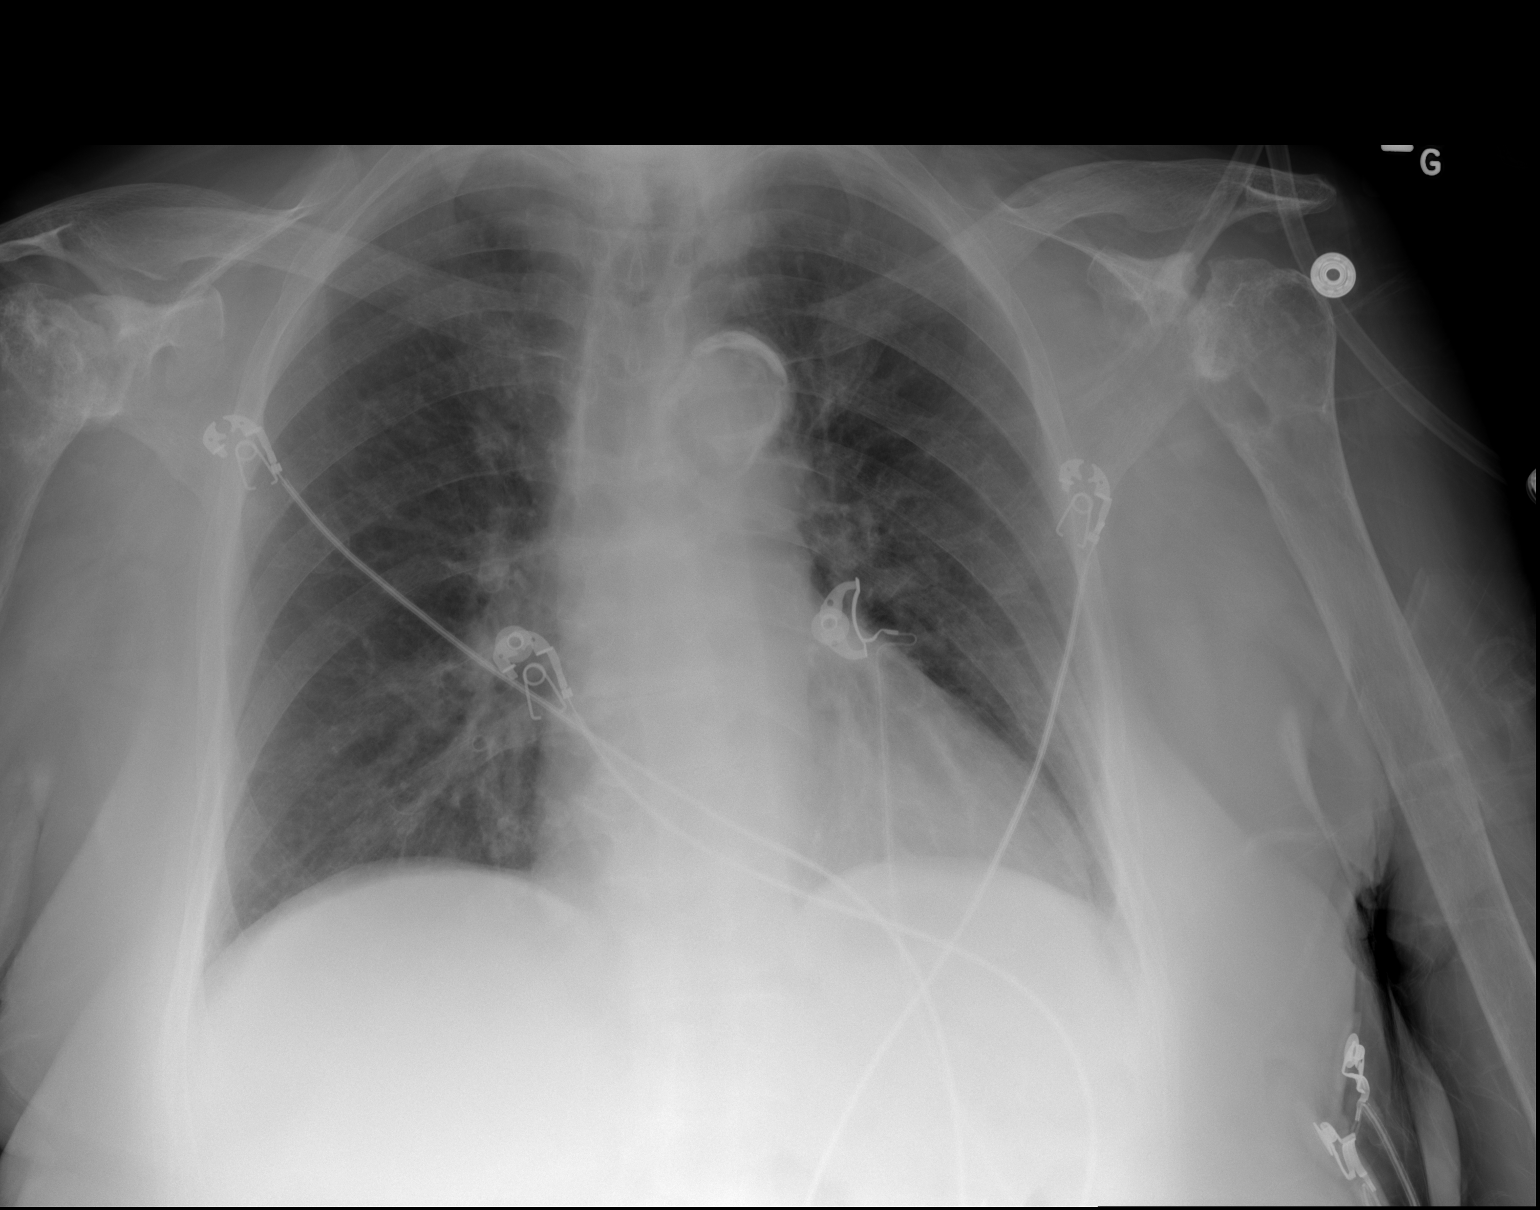

[2 of 2 positions shown; findings below may reference images not displayed]

FINDINGS: Enlargement of cardiac silhouette.

Atherosclerotic calcification aorta.

Mediastinal contours pulmonary vascularity normal.

Chronic peribronchial thickening without infiltrate, pleural
effusion or pneumothorax.

Prior cervical spine fusion.

Bones demineralized.
IMPRESSION: Enlargement of cardiac silhouette.

No acute abnormalities.
